# Patient Record
Sex: Female | Born: 1955 | Race: White | Hispanic: No | Marital: Married | Smoking: Former smoker
Health system: Southern US, Community
[De-identification: ages and names within clinical notes are randomized; demographics above are authoritative.]

## PROBLEM LIST (undated history)

## (undated) DIAGNOSIS — C801 Malignant (primary) neoplasm, unspecified: Secondary | ICD-10-CM

## (undated) DIAGNOSIS — N6019 Diffuse cystic mastopathy of unspecified breast: Secondary | ICD-10-CM

## (undated) HISTORY — PX: LUNG BIOPSY: SHX232

## (undated) HISTORY — PX: PORTACATH PLACEMENT: SHX2246

## (undated) HISTORY — DX: Malignant (primary) neoplasm, unspecified: C80.1

## (undated) HISTORY — DX: Diffuse cystic mastopathy of unspecified breast: N60.19

---

## 1992-09-04 HISTORY — PX: TUBAL LIGATION: SHX77

## 2001-12-06 ENCOUNTER — Other Ambulatory Visit: Admission: RE | Admit: 2001-12-06 | Discharge: 2001-12-06 | Payer: Self-pay | Admitting: Family Medicine

## 2005-02-16 ENCOUNTER — Ambulatory Visit: Payer: Self-pay | Admitting: General Surgery

## 2006-03-06 ENCOUNTER — Ambulatory Visit: Payer: Self-pay | Admitting: General Surgery

## 2006-09-04 HISTORY — PX: BREAST BIOPSY: SHX20

## 2007-03-13 ENCOUNTER — Ambulatory Visit: Payer: Self-pay | Admitting: General Surgery

## 2007-03-18 ENCOUNTER — Ambulatory Visit: Payer: Self-pay | Admitting: General Surgery

## 2007-09-13 ENCOUNTER — Ambulatory Visit: Payer: Self-pay | Admitting: General Surgery

## 2008-04-07 ENCOUNTER — Ambulatory Visit: Payer: Self-pay | Admitting: General Surgery

## 2009-04-09 ENCOUNTER — Ambulatory Visit: Payer: Self-pay | Admitting: General Surgery

## 2009-04-16 ENCOUNTER — Ambulatory Visit: Payer: Self-pay | Admitting: General Surgery

## 2010-04-12 ENCOUNTER — Ambulatory Visit: Payer: Self-pay

## 2010-04-15 ENCOUNTER — Ambulatory Visit: Payer: Self-pay

## 2011-04-18 ENCOUNTER — Ambulatory Visit: Payer: Self-pay

## 2012-05-02 ENCOUNTER — Ambulatory Visit: Payer: Self-pay

## 2013-01-24 ENCOUNTER — Encounter: Payer: Self-pay | Admitting: *Deleted

## 2013-03-04 DIAGNOSIS — C801 Malignant (primary) neoplasm, unspecified: Secondary | ICD-10-CM

## 2013-03-04 HISTORY — DX: Malignant (primary) neoplasm, unspecified: C80.1

## 2013-03-28 ENCOUNTER — Ambulatory Visit: Payer: Self-pay | Admitting: Family Medicine

## 2013-04-01 ENCOUNTER — Ambulatory Visit: Payer: Self-pay | Admitting: Family Medicine

## 2013-04-03 ENCOUNTER — Ambulatory Visit: Payer: Self-pay | Admitting: Oncology

## 2013-04-03 LAB — COMPREHENSIVE METABOLIC PANEL
Alkaline Phosphatase: 114 U/L (ref 50–136)
Bilirubin,Total: 0.2 mg/dL (ref 0.2–1.0)
Calcium, Total: 9.3 mg/dL (ref 8.5–10.1)
Chloride: 102 mmol/L (ref 98–107)
Co2: 29 mmol/L (ref 21–32)
Creatinine: 0.95 mg/dL (ref 0.60–1.30)
EGFR (Non-African Amer.): >60
Glucose: 102 mg/dL — ABNORMAL HIGH (ref 65–99)
Potassium: 3.8 mmol/L (ref 3.5–5.1)
SGPT (ALT): 23 U/L (ref 12–78)
Total Protein: 7.8 g/dL (ref 6.4–8.2)

## 2013-04-03 LAB — CBC CANCER CENTER
Basophil #: 0.1 x10 3/mm (ref 0.0–0.1)
Basophil %: 0.9 %
Eosinophil #: 0.1 x10 3/mm (ref 0.0–0.7)
Eosinophil %: 0.6 %
HCT: 41.7 % (ref 35.0–47.0)
Lymphocyte %: 17.5 %
MCH: 33 pg (ref 26.0–34.0)
MCHC: 34.4 g/dL (ref 32.0–36.0)
MCV: 96 fL (ref 80–100)
Monocyte %: 9 %
Neutrophil #: 5.8 x10 3/mm (ref 1.4–6.5)
Neutrophil %: 72 %
Platelet: 299 x10 3/mm (ref 150–440)
RBC: 4.35 10*6/uL (ref 3.80–5.20)
RDW: 12.8 % (ref 11.5–14.5)
WBC: 8.1 x10 3/mm (ref 3.6–11.0)

## 2013-04-04 ENCOUNTER — Ambulatory Visit: Payer: Self-pay | Admitting: Oncology

## 2013-04-04 ENCOUNTER — Ambulatory Visit: Payer: Self-pay | Admitting: Internal Medicine

## 2013-04-09 ENCOUNTER — Ambulatory Visit: Payer: Self-pay | Admitting: Oncology

## 2013-04-15 ENCOUNTER — Institutional Professional Consult (permissible substitution): Payer: Self-pay | Admitting: Pulmonary Disease

## 2013-04-16 ENCOUNTER — Ambulatory Visit: Payer: Self-pay | Admitting: Vascular Surgery

## 2013-04-22 LAB — COMPREHENSIVE METABOLIC PANEL
Alkaline Phosphatase: 111 U/L (ref 50–136)
Bilirubin,Total: 0.2 mg/dL (ref 0.2–1.0)
Chloride: 104 mmol/L (ref 98–107)
EGFR (African American): >60
Potassium: 3.7 mmol/L (ref 3.5–5.1)
SGOT(AST): 23 U/L (ref 15–37)

## 2013-04-22 LAB — CBC CANCER CENTER
Basophil %: 1.5 %
Eosinophil %: 2.5 %
HCT: 38 % (ref 35.0–47.0)
HGB: 13.3 g/dL (ref 12.0–16.0)
Lymphocyte #: 1 x10 3/mm (ref 1.0–3.6)
MCHC: 34.9 g/dL (ref 32.0–36.0)
Neutrophil #: 4.4 x10 3/mm (ref 1.4–6.5)
Neutrophil %: 73.5 %
Platelet: 324 x10 3/mm (ref 150–440)
RBC: 3.98 10*6/uL (ref 3.80–5.20)
RDW: 12.4 % (ref 11.5–14.5)

## 2013-04-29 LAB — CBC CANCER CENTER
Basophil #: 0.1 x10 3/mm (ref 0.0–0.1)
Basophil %: 2.4 %
HGB: 15 g/dL (ref 12.0–16.0)
Lymphocyte #: 1.1 x10 3/mm (ref 1.0–3.6)
MCV: 95 fL (ref 80–100)
Neutrophil #: 3.2 x10 3/mm (ref 1.4–6.5)
Neutrophil %: 70.5 %
Platelet: 226 x10 3/mm (ref 150–440)
RBC: 4.52 10*6/uL (ref 3.80–5.20)
RDW: 12.2 % (ref 11.5–14.5)

## 2013-04-29 LAB — BASIC METABOLIC PANEL
Anion Gap: 7 (ref 7–16)
Chloride: 99 mmol/L (ref 98–107)
Creatinine: 0.95 mg/dL (ref 0.60–1.30)
EGFR (African American): >60
Glucose: 119 mg/dL — ABNORMAL HIGH (ref 65–99)
Osmolality: 276 (ref 275–301)
Potassium: 4.7 mmol/L (ref 3.5–5.1)

## 2013-05-05 ENCOUNTER — Ambulatory Visit: Payer: Self-pay | Admitting: Oncology

## 2013-05-13 LAB — COMPREHENSIVE METABOLIC PANEL
Alkaline Phosphatase: 135 U/L (ref 50–136)
Anion Gap: 7 (ref 7–16)
BUN: 10 mg/dL (ref 7–18)
Bilirubin,Total: 0.2 mg/dL (ref 0.2–1.0)
Chloride: 105 mmol/L (ref 98–107)
Co2: 29 mmol/L (ref 21–32)
Creatinine: 1.1 mg/dL (ref 0.60–1.30)
EGFR (African American): >60
EGFR (Non-African Amer.): 56 — ABNORMAL LOW
Glucose: 102 mg/dL — ABNORMAL HIGH (ref 65–99)
Osmolality: 280 (ref 275–301)
Potassium: 3.7 mmol/L (ref 3.5–5.1)
SGOT(AST): 26 U/L (ref 15–37)
SGPT (ALT): 46 U/L (ref 12–78)
Sodium: 141 mmol/L (ref 136–145)
Total Protein: 7.2 g/dL (ref 6.4–8.2)

## 2013-05-13 LAB — CBC CANCER CENTER
Basophil #: 0.1 x10 3/mm (ref 0.0–0.1)
Basophil %: 1.8 %
Eosinophil %: 1.8 %
HCT: 38.1 % (ref 35.0–47.0)
Lymphocyte #: 1.4 x10 3/mm (ref 1.0–3.6)
Lymphocyte %: 39 %
MCH: 33 pg (ref 26.0–34.0)
MCV: 96 fL (ref 80–100)
Monocyte %: 15.5 %
Neutrophil #: 1.5 x10 3/mm (ref 1.4–6.5)
Platelet: 307 x10 3/mm (ref 150–440)
RBC: 3.97 10*6/uL (ref 3.80–5.20)
RDW: 12.5 % (ref 11.5–14.5)
WBC: 3.6 x10 3/mm (ref 3.6–11.0)

## 2013-05-14 ENCOUNTER — Ambulatory Visit: Payer: Self-pay

## 2013-05-20 LAB — CBC CANCER CENTER
Basophil #: 0 x10 3/mm (ref 0.0–0.1)
Eosinophil #: 0 x10 3/mm (ref 0.0–0.7)
Eosinophil %: 0.1 %
HGB: 13.4 g/dL (ref 12.0–16.0)
Lymphocyte %: 5 %
MCH: 33 pg (ref 26.0–34.0)
Monocyte %: 0.8 %
Neutrophil #: 8.3 x10 3/mm — ABNORMAL HIGH (ref 1.4–6.5)
Platelet: 258 x10 3/mm (ref 150–440)
WBC: 8.9 x10 3/mm (ref 3.6–11.0)

## 2013-05-30 LAB — CBC CANCER CENTER
Basophil #: 0 x10 3/mm (ref 0.0–0.1)
Basophil %: 1 %
HGB: 12.1 g/dL (ref 12.0–16.0)
MCHC: 34.3 g/dL (ref 32.0–36.0)
MCV: 97 fL (ref 80–100)
Monocyte #: 0.5 x10 3/mm (ref 0.2–0.9)
Monocyte %: 28.3 %
Neutrophil #: 0.4 x10 3/mm — ABNORMAL LOW (ref 1.4–6.5)
Neutrophil %: 23.5 %
RBC: 3.63 10*6/uL — ABNORMAL LOW (ref 3.80–5.20)
RDW: 13.4 % (ref 11.5–14.5)
WBC: 1.6 x10 3/mm — CL (ref 3.6–11.0)

## 2013-06-03 LAB — CBC CANCER CENTER
Basophil #: 0 x10 3/mm (ref 0.0–0.1)
Basophil %: 0.9 %
Lymphocyte #: 0.8 x10 3/mm — ABNORMAL LOW (ref 1.0–3.6)
Lymphocyte %: 21.6 %
MCH: 33.1 pg (ref 26.0–34.0)
Monocyte %: 17.9 %
Neutrophil %: 57.5 %
Platelet: 414 x10 3/mm (ref 150–440)
RBC: 3.66 10*6/uL — ABNORMAL LOW (ref 3.80–5.20)
RDW: 15.5 % — ABNORMAL HIGH (ref 11.5–14.5)

## 2013-06-03 LAB — COMPREHENSIVE METABOLIC PANEL
Albumin: 3.4 g/dL (ref 3.4–5.0)
Alkaline Phosphatase: 140 U/L — ABNORMAL HIGH (ref 50–136)
Anion Gap: 13 (ref 7–16)
BUN: 10 mg/dL (ref 7–18)
Bilirubin,Total: 0.1 mg/dL — ABNORMAL LOW (ref 0.2–1.0)
Chloride: 104 mmol/L (ref 98–107)
EGFR (African American): >60
Glucose: 111 mg/dL — ABNORMAL HIGH (ref 65–99)
Osmolality: 283 (ref 275–301)
Potassium: 3.7 mmol/L (ref 3.5–5.1)
SGOT(AST): 21 U/L (ref 15–37)
SGPT (ALT): 34 U/L (ref 12–78)
Total Protein: 7.2 g/dL (ref 6.4–8.2)

## 2013-06-04 ENCOUNTER — Ambulatory Visit: Payer: Self-pay | Admitting: Oncology

## 2013-06-10 LAB — BASIC METABOLIC PANEL
Anion Gap: 8 (ref 7–16)
BUN: 10 mg/dL (ref 7–18)
Calcium, Total: 8.9 mg/dL (ref 8.5–10.1)
Chloride: 97 mmol/L — ABNORMAL LOW (ref 98–107)
Co2: 28 mmol/L (ref 21–32)
Creatinine: 0.88 mg/dL (ref 0.60–1.30)
EGFR (Non-African Amer.): >60
Glucose: 145 mg/dL — ABNORMAL HIGH (ref 65–99)
Potassium: 4.1 mmol/L (ref 3.5–5.1)
Sodium: 133 mmol/L — ABNORMAL LOW (ref 136–145)

## 2013-06-10 LAB — CBC CANCER CENTER
Basophil #: 0.1 x10 3/mm (ref 0.0–0.1)
Basophil %: 0.6 %
Eosinophil #: 0 x10 3/mm (ref 0.0–0.7)
Eosinophil %: 0.1 %
HCT: 33.3 % — ABNORMAL LOW (ref 35.0–47.0)
HGB: 11.6 g/dL — ABNORMAL LOW (ref 12.0–16.0)
Lymphocyte #: 0.4 x10 3/mm — ABNORMAL LOW (ref 1.0–3.6)
MCH: 34.2 pg — ABNORMAL HIGH (ref 26.0–34.0)
MCHC: 34.8 g/dL (ref 32.0–36.0)
MCV: 98 fL (ref 80–100)
Monocyte %: 1 %
Neutrophil %: 93.8 %
Platelet: 252 x10 3/mm (ref 150–440)
RDW: 15.4 % — ABNORMAL HIGH (ref 11.5–14.5)
WBC: 8.4 x10 3/mm (ref 3.6–11.0)

## 2013-06-12 ENCOUNTER — Ambulatory Visit (INDEPENDENT_AMBULATORY_CARE_PROVIDER_SITE_OTHER): Payer: PRIVATE HEALTH INSURANCE | Admitting: General Surgery

## 2013-06-12 ENCOUNTER — Encounter: Payer: Self-pay | Admitting: General Surgery

## 2013-06-12 VITALS — BP 100/70 | HR 92 | Resp 14 | Ht 64.0 in | Wt 127.0 lb

## 2013-06-12 DIAGNOSIS — Z803 Family history of malignant neoplasm of breast: Secondary | ICD-10-CM

## 2013-06-12 DIAGNOSIS — N6019 Diffuse cystic mastopathy of unspecified breast: Secondary | ICD-10-CM

## 2013-06-12 DIAGNOSIS — Z85118 Personal history of other malignant neoplasm of bronchus and lung: Secondary | ICD-10-CM

## 2013-06-12 NOTE — Patient Instructions (Addendum)
Patient to return in 1 year for follow up. Continue self breast exam. Call for any problems.

## 2013-06-12 NOTE — Progress Notes (Signed)
Patient ID: Marcia Fuentes, female   DOB: Jan 08, 1956, 57 y.o.   MRN: [id number]  Chief Complaint  Patient presents with  . Follow-up    8 month follow up screening mammogram     HPI Marcia Fuentes is a 57 y.o. female who presents for a breast evaluation. The most recent mammogram was done on 05/14/13 with a birad category 1. Patient does perform regular self breast checks and gets regular mammograms done.  No new breast problems at this time. 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 The patient is undergoing radiation and chemotherapy at this time. She is due to complete treatment soon.   HPI  Past Medical History  Diagnosis Date  . Diffuse cystic mastopathy   . Cancer     lung    Past Surgical History  Procedure Laterality Date  . Tubal ligation  1994  . Breast biopsy Left 2008  . Portacath placement    . Lung biopsy      Family History  Problem Relation Age of Onset  . Cancer Mother     breast  . Cancer Maternal Aunt     breast  . Cancer Maternal Grandmother     breast  . Cancer Maternal Aunt     breast    Social History History  Substance Use Topics  . Smoking status: Former Smoker -- 1.00 packs/day for 30 years  . Smokeless tobacco: Never Used  . Alcohol Use: No    Allergies  Allergen Reactions  . Sulfa Antibiotics     Cramps     Current Outpatient Prescriptions  Medication Sig Dispense Refill  . benzonatate (TESSALON) 100 MG capsule Take 100 mg by mouth 3 (three) times daily as needed for cough.      . dexamethasone (DECADRON) 4 MG tablet Take 4 mg by mouth 2 (two) times daily with a meal.      . oxyCODONE-acetaminophen (PERCOCET) 7.5-325 MG per tablet Take 1 tablet by mouth every 6 (six) hours as needed for pain.      Marland Kitchen senna-docusate (SENOKOT-S) 8.6-50 MG per tablet Take 2 tablets by mouth at bedtime.      . sucralfate (CARAFATE) 1 G tablet Take 1 g by mouth 3 (three) times daily.       No current facility-administered  medications for this visit.    Review of Systems Review of Systems  Constitutional: Negative.   Respiratory: Negative.   Cardiovascular: Negative.     Blood pressure 100/70, pulse 92, resp. rate 14, height 5\' 4"  (1.626 m), weight 127 lb (57.607 kg).  Physical Exam Physical Exam  Constitutional: She is oriented to person, place, and time. She appears well-developed and well-nourished.  Eyes: Conjunctivae are normal. No scleral icterus.  Neck: No mass and no thyromegaly present.  Cardiovascular: Normal rate, regular rhythm and normal heart sounds.   No murmur heard. Pulses:      Dorsalis pedis pulses are 2+ on the right side, and 2+ on the left side.       Posterior tibial pulses are 2+ on the right side, and 2+ on the left side.  No edema present.  Pulmonary/Chest: Effort normal and breath sounds normal. Right breast exhibits no inverted nipple, no mass, no nipple discharge, no skin change and no tenderness. Left breast exhibits no inverted nipple, no mass, no nipple discharge, no skin change and no tenderness.  Abdominal: Soft. Bowel sounds are normal. There is no hepatosplenomegaly. There is no tenderness.  No hernia.  Lymphadenopathy:    She has no cervical adenopathy.    She has no axillary adenopathy.  Neurological: She is alert and oriented to person, place, and time.  Skin: Skin is warm and dry.    Data Reviewed Mammogram reviewed and stable.    Assessment    Patient's breast exam is stable. She is currently completing radiation and chemotherapy for lung cancer. Assuming she has had a good response we will plan for continued breast follow up due to family history of breast cancer.      Plan    Patient to return in 1 year with bilateral screening mammogram.        Gerlene Burdock G 06/12/2013, 7:12 PM

## 2013-06-13 LAB — CBC CANCER CENTER
Basophil #: 0 x10 3/mm (ref 0.0–0.1)
Basophil %: 0.8 %
HCT: 29.7 % — ABNORMAL LOW (ref 35.0–47.0)
Lymphocyte %: 28.8 %
MCH: 34.2 pg — ABNORMAL HIGH (ref 26.0–34.0)
MCV: 99 fL (ref 80–100)
Monocyte #: 0.1 x10 3/mm — ABNORMAL LOW (ref 0.2–0.9)
Monocyte %: 8.9 %
Neutrophil #: 0.8 x10 3/mm — ABNORMAL LOW (ref 1.4–6.5)
WBC: 1.3 x10 3/mm — CL (ref 3.6–11.0)

## 2013-06-24 LAB — COMPREHENSIVE METABOLIC PANEL WITH GFR
Albumin: 2.9 g/dL — ABNORMAL LOW (ref 3.4–5.0)
Alkaline Phosphatase: 102 U/L (ref 50–136)
Anion Gap: 10 (ref 7–16)
BUN: 14 mg/dL (ref 7–18)
Bilirubin,Total: 0.2 mg/dL (ref 0.2–1.0)
Calcium, Total: 8 mg/dL — ABNORMAL LOW (ref 8.5–10.1)
Chloride: 101 mmol/L (ref 98–107)
Co2: 25 mmol/L (ref 21–32)
Creatinine: 0.94 mg/dL (ref 0.60–1.30)
EGFR (African American): >60
EGFR (Non-African Amer.): >60
Glucose: 154 mg/dL — ABNORMAL HIGH (ref 65–99)
Osmolality: 276 (ref 275–301)
Potassium: 3.7 mmol/L (ref 3.5–5.1)
SGOT(AST): 107 U/L — ABNORMAL HIGH (ref 15–37)
SGPT (ALT): 189 U/L — ABNORMAL HIGH (ref 12–78)
Sodium: 136 mmol/L (ref 136–145)
Total Protein: 6.2 g/dL — ABNORMAL LOW (ref 6.4–8.2)

## 2013-06-24 LAB — CBC CANCER CENTER
Basophil #: 0.1 x10 3/mm (ref 0.0–0.1)
Basophil %: 0.8 %
Eosinophil #: 0.1 x10 3/mm (ref 0.0–0.7)
Eosinophil %: 1.1 %
HCT: 35.4 % (ref 35.0–47.0)
HGB: 12.1 g/dL (ref 12.0–16.0)
Lymphocyte %: 10.3 %
Lymphs Abs: 0.8 x10 3/mm — ABNORMAL LOW (ref 1.0–3.6)
MCH: 35.4 pg — ABNORMAL HIGH (ref 26.0–34.0)
MCHC: 34.1 g/dL (ref 32.0–36.0)
MCV: 104 fL — ABNORMAL HIGH (ref 80–100)
Monocyte #: 0.8 x10 3/mm (ref 0.2–0.9)
Monocyte %: 11.4 %
Neutrophil #: 5.7 x10 3/mm (ref 1.4–6.5)
Neutrophil %: 76.4 %
Platelet: 274 x10 3/mm (ref 150–440)
RBC: 3.41 x10 6/mm — ABNORMAL LOW (ref 3.80–5.20)
RDW: 20.7 % — ABNORMAL HIGH (ref 11.5–14.5)
WBC: 7.5 x10 3/mm (ref 3.6–11.0)

## 2013-07-01 LAB — CBC CANCER CENTER
Basophil %: 1.4 %
Eosinophil %: 0.1 %
HCT: 33.8 % — ABNORMAL LOW (ref 35.0–47.0)
HGB: 11.8 g/dL — ABNORMAL LOW (ref 12.0–16.0)
Lymphocyte #: 0.4 x10 3/mm — ABNORMAL LOW (ref 1.0–3.6)
Lymphocyte %: 10.3 %
MCH: 35.3 pg — ABNORMAL HIGH (ref 26.0–34.0)
MCHC: 35 g/dL (ref 32.0–36.0)
MCV: 101 fL — ABNORMAL HIGH (ref 80–100)
Neutrophil %: 87.3 %
Platelet: 155 x10 3/mm (ref 150–440)
RBC: 3.34 10*6/uL — ABNORMAL LOW (ref 3.80–5.20)
RDW: 19.2 % — ABNORMAL HIGH (ref 11.5–14.5)
WBC: 4.1 x10 3/mm (ref 3.6–11.0)

## 2013-07-01 LAB — BASIC METABOLIC PANEL
Anion Gap: 7 (ref 7–16)
BUN: 12 mg/dL (ref 7–18)
Chloride: 100 mmol/L (ref 98–107)
Co2: 29 mmol/L (ref 21–32)
Creatinine: 0.78 mg/dL (ref 0.60–1.30)
EGFR (Non-African Amer.): >60
Osmolality: 271 (ref 275–301)
Potassium: 3.9 mmol/L (ref 3.5–5.1)
Sodium: 136 mmol/L (ref 136–145)

## 2013-07-05 ENCOUNTER — Ambulatory Visit: Payer: Self-pay | Admitting: Oncology

## 2013-08-05 ENCOUNTER — Ambulatory Visit: Payer: Self-pay | Admitting: Oncology

## 2013-08-05 LAB — CBC CANCER CENTER
Basophil %: 0.6 %
Eosinophil %: 0.5 %
HCT: 36.8 % (ref 35.0–47.0)
HGB: 12.5 g/dL (ref 12.0–16.0)
Lymphocyte #: 1 x10 3/mm (ref 1.0–3.6)
MCH: 36.6 pg — ABNORMAL HIGH (ref 26.0–34.0)
MCHC: 33.9 g/dL (ref 32.0–36.0)
Monocyte %: 12.8 %
Neutrophil #: 8.8 x10 3/mm — ABNORMAL HIGH (ref 1.4–6.5)
RDW: 15.1 % — ABNORMAL HIGH (ref 11.5–14.5)

## 2013-08-05 LAB — COMPREHENSIVE METABOLIC PANEL
Alkaline Phosphatase: 99 U/L
Anion Gap: 9 (ref 7–16)
Chloride: 101 mmol/L (ref 98–107)
Co2: 26 mmol/L (ref 21–32)
Creatinine: 0.87 mg/dL (ref 0.60–1.30)
EGFR (African American): >60
Glucose: 106 mg/dL — ABNORMAL HIGH (ref 65–99)
Osmolality: 271 (ref 275–301)
Total Protein: 7.2 g/dL (ref 6.4–8.2)

## 2013-09-04 ENCOUNTER — Ambulatory Visit: Payer: Self-pay | Admitting: Oncology

## 2013-10-05 ENCOUNTER — Ambulatory Visit: Payer: Self-pay | Admitting: Oncology

## 2013-11-05 ENCOUNTER — Ambulatory Visit: Payer: Self-pay | Admitting: Oncology

## 2013-11-06 ENCOUNTER — Ambulatory Visit: Payer: Self-pay | Admitting: Oncology

## 2013-11-07 LAB — CBC CANCER CENTER
BASOS PCT: 1.1 %
Basophil #: 0.1 x10 3/mm (ref 0.0–0.1)
Eosinophil #: 0.1 x10 3/mm (ref 0.0–0.7)
Eosinophil %: 2.1 %
HCT: 40.7 % (ref 35.0–47.0)
HGB: 13.4 g/dL (ref 12.0–16.0)
Lymphocyte #: 1.3 x10 3/mm (ref 1.0–3.6)
Lymphocyte %: 20.2 %
MCH: 32.2 pg (ref 26.0–34.0)
MCHC: 33 g/dL (ref 32.0–36.0)
MCV: 97 fL (ref 80–100)
MONOS PCT: 10.9 %
Monocyte #: 0.7 x10 3/mm (ref 0.2–0.9)
Neutrophil #: 4.3 x10 3/mm (ref 1.4–6.5)
Neutrophil %: 65.7 %
Platelet: 299 x10 3/mm (ref 150–440)
RBC: 4.18 10*6/uL (ref 3.80–5.20)
RDW: 13.1 % (ref 11.5–14.5)
WBC: 6.5 x10 3/mm (ref 3.6–11.0)

## 2013-11-07 LAB — COMPREHENSIVE METABOLIC PANEL
ANION GAP: 8 (ref 7–16)
AST: 20 U/L (ref 15–37)
Albumin: 3.5 g/dL (ref 3.4–5.0)
Alkaline Phosphatase: 94 U/L
BUN: 11 mg/dL (ref 7–18)
Bilirubin,Total: 0.3 mg/dL (ref 0.2–1.0)
CO2: 29 mmol/L (ref 21–32)
Calcium, Total: 9.5 mg/dL (ref 8.5–10.1)
Chloride: 102 mmol/L (ref 98–107)
Creatinine: 0.97 mg/dL (ref 0.60–1.30)
Glucose: 126 mg/dL — ABNORMAL HIGH (ref 65–99)
Osmolality: 278 (ref 275–301)
Potassium: 3.7 mmol/L (ref 3.5–5.1)
SGPT (ALT): 25 U/L (ref 12–78)
SODIUM: 139 mmol/L (ref 136–145)
Total Protein: 7.5 g/dL (ref 6.4–8.2)

## 2013-11-11 ENCOUNTER — Ambulatory Visit: Payer: Self-pay | Admitting: Vascular Surgery

## 2013-12-03 ENCOUNTER — Ambulatory Visit: Payer: Self-pay | Admitting: Oncology

## 2014-02-09 ENCOUNTER — Ambulatory Visit: Payer: Self-pay | Admitting: Oncology

## 2014-02-12 ENCOUNTER — Ambulatory Visit: Payer: Self-pay | Admitting: Oncology

## 2014-02-12 LAB — COMPREHENSIVE METABOLIC PANEL
ALBUMIN: 3.7 g/dL (ref 3.4–5.0)
Alkaline Phosphatase: 86 U/L
Anion Gap: 7 (ref 7–16)
BUN: 11 mg/dL (ref 7–18)
Bilirubin,Total: 0.3 mg/dL (ref 0.2–1.0)
CO2: 30 mmol/L (ref 21–32)
CREATININE: 0.88 mg/dL (ref 0.60–1.30)
Calcium, Total: 9.3 mg/dL (ref 8.5–10.1)
Chloride: 105 mmol/L (ref 98–107)
EGFR (African American): >60
Glucose: 125 mg/dL — ABNORMAL HIGH (ref 65–99)
Osmolality: 284 (ref 275–301)
Potassium: 4.1 mmol/L (ref 3.5–5.1)
SGOT(AST): 22 U/L (ref 15–37)
SGPT (ALT): 32 U/L (ref 12–78)
Sodium: 142 mmol/L (ref 136–145)
Total Protein: 7.3 g/dL (ref 6.4–8.2)

## 2014-02-12 LAB — CBC CANCER CENTER
Basophil #: 0 x10 3/mm (ref 0.0–0.1)
Basophil %: 0.9 %
Eosinophil #: 0.1 x10 3/mm (ref 0.0–0.7)
Eosinophil %: 2.1 %
HCT: 40.2 % (ref 35.0–47.0)
HGB: 13.7 g/dL (ref 12.0–16.0)
LYMPHS PCT: 19.1 %
Lymphocyte #: 0.9 x10 3/mm — ABNORMAL LOW (ref 1.0–3.6)
MCH: 33.4 pg (ref 26.0–34.0)
MCHC: 34 g/dL (ref 32.0–36.0)
MCV: 98 fL (ref 80–100)
MONOS PCT: 9.5 %
Monocyte #: 0.5 x10 3/mm (ref 0.2–0.9)
Neutrophil #: 3.4 x10 3/mm (ref 1.4–6.5)
Neutrophil %: 68.4 %
Platelet: 231 x10 3/mm (ref 150–440)
RBC: 4.09 10*6/uL (ref 3.80–5.20)
RDW: 13 % (ref 11.5–14.5)
WBC: 4.9 x10 3/mm (ref 3.6–11.0)

## 2014-02-18 ENCOUNTER — Ambulatory Visit: Payer: Self-pay | Admitting: Oncology

## 2014-02-23 LAB — CBC CANCER CENTER
BASOS ABS: 0.1 x10 3/mm (ref 0.0–0.1)
Basophil %: 1 %
Eosinophil #: 0.1 x10 3/mm (ref 0.0–0.7)
Eosinophil %: 1.6 %
HCT: 41.7 % (ref 35.0–47.0)
HGB: 13.6 g/dL (ref 12.0–16.0)
LYMPHS ABS: 1 x10 3/mm (ref 1.0–3.6)
LYMPHS PCT: 19.9 %
MCH: 32.3 pg (ref 26.0–34.0)
MCHC: 32.6 g/dL (ref 32.0–36.0)
MCV: 99 fL (ref 80–100)
Monocyte #: 0.4 x10 3/mm (ref 0.2–0.9)
Monocyte %: 7.8 %
NEUTROS PCT: 69.7 %
Neutrophil #: 3.4 x10 3/mm (ref 1.4–6.5)
Platelet: 223 x10 3/mm (ref 150–440)
RBC: 4.21 10*6/uL (ref 3.80–5.20)
RDW: 12.6 % (ref 11.5–14.5)
WBC: 4.8 x10 3/mm (ref 3.6–11.0)

## 2014-02-23 LAB — COMPREHENSIVE METABOLIC PANEL
ALT: 31 U/L (ref 12–78)
AST: 24 U/L (ref 15–37)
Albumin: 3.8 g/dL (ref 3.4–5.0)
Alkaline Phosphatase: 86 U/L
Anion Gap: 7 (ref 7–16)
BUN: 9 mg/dL (ref 7–18)
Bilirubin,Total: 0.3 mg/dL (ref 0.2–1.0)
CO2: 29 mmol/L (ref 21–32)
Calcium, Total: 9.5 mg/dL (ref 8.5–10.1)
Chloride: 104 mmol/L (ref 98–107)
Creatinine: 1.01 mg/dL (ref 0.60–1.30)
EGFR (Non-African Amer.): >60
Glucose: 132 mg/dL — ABNORMAL HIGH (ref 65–99)
Osmolality: 280 (ref 275–301)
Potassium: 3.7 mmol/L (ref 3.5–5.1)
SODIUM: 140 mmol/L (ref 136–145)
TOTAL PROTEIN: 7.5 g/dL (ref 6.4–8.2)

## 2014-03-03 LAB — COMPREHENSIVE METABOLIC PANEL
ALBUMIN: 3.6 g/dL (ref 3.4–5.0)
Alkaline Phosphatase: 89 U/L
Anion Gap: 7 (ref 7–16)
BILIRUBIN TOTAL: 0.1 mg/dL — AB (ref 0.2–1.0)
BUN: 14 mg/dL (ref 7–18)
CALCIUM: 9.3 mg/dL (ref 8.5–10.1)
CREATININE: 1.06 mg/dL (ref 0.60–1.30)
Chloride: 105 mmol/L (ref 98–107)
Co2: 31 mmol/L (ref 21–32)
EGFR (Non-African Amer.): 59 — ABNORMAL LOW
Glucose: 115 mg/dL — ABNORMAL HIGH (ref 65–99)
OSMOLALITY: 286 (ref 275–301)
Potassium: 3.9 mmol/L (ref 3.5–5.1)
SGOT(AST): 30 U/L (ref 15–37)
SGPT (ALT): 62 U/L (ref 12–78)
Sodium: 143 mmol/L (ref 136–145)
Total Protein: 7.2 g/dL (ref 6.4–8.2)

## 2014-03-03 LAB — CBC CANCER CENTER
Basophil #: 0 x10 3/mm (ref 0.0–0.1)
Basophil %: 0.9 %
Eosinophil #: 0.1 x10 3/mm (ref 0.0–0.7)
Eosinophil %: 3.9 %
HCT: 36.9 % (ref 35.0–47.0)
HGB: 12.4 g/dL (ref 12.0–16.0)
Lymphocyte #: 1 x10 3/mm (ref 1.0–3.6)
Lymphocyte %: 27.9 %
MCH: 33.2 pg (ref 26.0–34.0)
MCHC: 33.7 g/dL (ref 32.0–36.0)
MCV: 99 fL (ref 80–100)
MONO ABS: 0.2 x10 3/mm (ref 0.2–0.9)
Monocyte %: 7 %
Neutrophil #: 2.2 x10 3/mm (ref 1.4–6.5)
Neutrophil %: 60.3 %
PLATELETS: 127 x10 3/mm — AB (ref 150–440)
RBC: 3.74 10*6/uL — AB (ref 3.80–5.20)
RDW: 12.4 % (ref 11.5–14.5)
WBC: 3.6 x10 3/mm (ref 3.6–11.0)

## 2014-03-04 ENCOUNTER — Ambulatory Visit: Payer: Self-pay | Admitting: Vascular Surgery

## 2014-03-04 ENCOUNTER — Ambulatory Visit: Payer: Self-pay | Admitting: Oncology

## 2014-03-10 ENCOUNTER — Ambulatory Visit: Payer: Self-pay | Admitting: Oncology

## 2014-03-10 LAB — CBC CANCER CENTER
BASOS ABS: 0 x10 3/mm (ref 0.0–0.1)
BASOS PCT: 1.1 %
EOS ABS: 0.1 x10 3/mm (ref 0.0–0.7)
Eosinophil %: 3.8 %
HCT: 34.6 % — ABNORMAL LOW (ref 35.0–47.0)
HGB: 11.7 g/dL — ABNORMAL LOW (ref 12.0–16.0)
Lymphocyte #: 0.9 x10 3/mm — ABNORMAL LOW (ref 1.0–3.6)
Lymphocyte %: 36.5 %
MCH: 33 pg (ref 26.0–34.0)
MCHC: 33.7 g/dL (ref 32.0–36.0)
MCV: 98 fL (ref 80–100)
Monocyte #: 0.1 x10 3/mm — ABNORMAL LOW (ref 0.2–0.9)
Monocyte %: 5.2 %
NEUTROS ABS: 1.3 x10 3/mm — AB (ref 1.4–6.5)
Neutrophil %: 53.4 %
Platelet: 96 x10 3/mm — ABNORMAL LOW (ref 150–440)
RBC: 3.53 10*6/uL — ABNORMAL LOW (ref 3.80–5.20)
RDW: 11.9 % (ref 11.5–14.5)
WBC: 2.5 x10 3/mm — AB (ref 3.6–11.0)

## 2014-03-10 LAB — COMPREHENSIVE METABOLIC PANEL
ALK PHOS: 92 U/L
AST: 33 U/L (ref 15–37)
Albumin: 3.4 g/dL (ref 3.4–5.0)
Anion Gap: 8 (ref 7–16)
BUN: 12 mg/dL (ref 7–18)
Bilirubin,Total: 0.2 mg/dL (ref 0.2–1.0)
CALCIUM: 9 mg/dL (ref 8.5–10.1)
Chloride: 105 mmol/L (ref 98–107)
Co2: 28 mmol/L (ref 21–32)
Creatinine: 0.9 mg/dL (ref 0.60–1.30)
EGFR (African American): >60
EGFR (Non-African Amer.): >60
GLUCOSE: 113 mg/dL — AB (ref 65–99)
Osmolality: 282 (ref 275–301)
Potassium: 3.9 mmol/L (ref 3.5–5.1)
SGPT (ALT): 60 U/L (ref 12–78)
Sodium: 141 mmol/L (ref 136–145)
Total Protein: 6.9 g/dL (ref 6.4–8.2)

## 2014-03-17 LAB — CBC CANCER CENTER
Basophil #: 0 x10 3/mm (ref 0.0–0.1)
Basophil %: 0.8 %
EOS PCT: 3.2 %
Eosinophil #: 0.1 x10 3/mm (ref 0.0–0.7)
HCT: 33.9 % — ABNORMAL LOW (ref 35.0–47.0)
HGB: 11.6 g/dL — ABNORMAL LOW (ref 12.0–16.0)
LYMPHS PCT: 43.4 %
Lymphocyte #: 0.8 x10 3/mm — ABNORMAL LOW (ref 1.0–3.6)
MCH: 33.3 pg (ref 26.0–34.0)
MCHC: 34.2 g/dL (ref 32.0–36.0)
MCV: 97 fL (ref 80–100)
MONOS PCT: 6.4 %
Monocyte #: 0.1 x10 3/mm — ABNORMAL LOW (ref 0.2–0.9)
Neutrophil #: 0.8 x10 3/mm — ABNORMAL LOW (ref 1.4–6.5)
Neutrophil %: 46.2 %
Platelet: 115 x10 3/mm — ABNORMAL LOW (ref 150–440)
RBC: 3.48 10*6/uL — ABNORMAL LOW (ref 3.80–5.20)
RDW: 11.8 % (ref 11.5–14.5)
WBC: 1.8 x10 3/mm — AB (ref 3.6–11.0)

## 2014-03-24 LAB — COMPREHENSIVE METABOLIC PANEL
ALK PHOS: 97 U/L
ALT: 64 U/L (ref 12–78)
ANION GAP: 7 (ref 7–16)
Albumin: 3.5 g/dL (ref 3.4–5.0)
BILIRUBIN TOTAL: 0.3 mg/dL (ref 0.2–1.0)
BUN: 9 mg/dL (ref 7–18)
CALCIUM: 9 mg/dL (ref 8.5–10.1)
CHLORIDE: 106 mmol/L (ref 98–107)
Co2: 29 mmol/L (ref 21–32)
Creatinine: 0.99 mg/dL (ref 0.60–1.30)
EGFR (Non-African Amer.): >60
Glucose: 106 mg/dL — ABNORMAL HIGH (ref 65–99)
Osmolality: 282 (ref 275–301)
POTASSIUM: 3.9 mmol/L (ref 3.5–5.1)
SGOT(AST): 34 U/L (ref 15–37)
Sodium: 142 mmol/L (ref 136–145)
Total Protein: 7.1 g/dL (ref 6.4–8.2)

## 2014-03-24 LAB — CBC CANCER CENTER
Basophil #: 0 x10 3/mm (ref 0.0–0.1)
Basophil %: 1.5 %
Eosinophil #: 0.1 x10 3/mm (ref 0.0–0.7)
Eosinophil %: 2.8 %
HCT: 35.7 % (ref 35.0–47.0)
HGB: 12 g/dL (ref 12.0–16.0)
Lymphocyte #: 0.7 x10 3/mm — ABNORMAL LOW (ref 1.0–3.6)
Lymphocyte %: 34.6 %
MCH: 33.7 pg (ref 26.0–34.0)
MCHC: 33.5 g/dL (ref 32.0–36.0)
MCV: 101 fL — AB (ref 80–100)
MONO ABS: 0.4 x10 3/mm (ref 0.2–0.9)
Monocyte %: 18.6 %
Neutrophil #: 0.9 x10 3/mm — ABNORMAL LOW (ref 1.4–6.5)
Neutrophil %: 42.5 %
PLATELETS: 299 x10 3/mm (ref 150–440)
RBC: 3.55 10*6/uL — AB (ref 3.80–5.20)
RDW: 13.4 % (ref 11.5–14.5)
WBC: 2.2 x10 3/mm — ABNORMAL LOW (ref 3.6–11.0)

## 2014-03-31 LAB — CBC CANCER CENTER
BASOS PCT: 2.1 %
Basophil #: 0.1 x10 3/mm (ref 0.0–0.1)
EOS ABS: 0.1 x10 3/mm (ref 0.0–0.7)
EOS PCT: 2.7 %
HCT: 37.7 % (ref 35.0–47.0)
HGB: 12.8 g/dL (ref 12.0–16.0)
LYMPHS ABS: 0.7 x10 3/mm — AB (ref 1.0–3.6)
Lymphocyte %: 25.2 %
MCH: 34.5 pg — AB (ref 26.0–34.0)
MCHC: 34 g/dL (ref 32.0–36.0)
MCV: 102 fL — AB (ref 80–100)
MONOS PCT: 21.6 %
Monocyte #: 0.6 x10 3/mm (ref 0.2–0.9)
Neutrophil #: 1.4 x10 3/mm (ref 1.4–6.5)
Neutrophil %: 48.4 %
PLATELETS: 329 x10 3/mm (ref 150–440)
RBC: 3.71 10*6/uL — ABNORMAL LOW (ref 3.80–5.20)
RDW: 15.7 % — ABNORMAL HIGH (ref 11.5–14.5)
WBC: 2.9 x10 3/mm — ABNORMAL LOW (ref 3.6–11.0)

## 2014-03-31 LAB — COMPREHENSIVE METABOLIC PANEL
ALBUMIN: 3.5 g/dL (ref 3.4–5.0)
AST: 25 U/L (ref 15–37)
Alkaline Phosphatase: 96 U/L
Anion Gap: 9 (ref 7–16)
BILIRUBIN TOTAL: 0.2 mg/dL (ref 0.2–1.0)
BUN: 9 mg/dL (ref 7–18)
CALCIUM: 9.1 mg/dL (ref 8.5–10.1)
Chloride: 105 mmol/L (ref 98–107)
Co2: 27 mmol/L (ref 21–32)
Creatinine: 0.93 mg/dL (ref 0.60–1.30)
EGFR (African American): >60
Glucose: 99 mg/dL (ref 65–99)
OSMOLALITY: 280 (ref 275–301)
Potassium: 3.7 mmol/L (ref 3.5–5.1)
SGPT (ALT): 44 U/L
Sodium: 141 mmol/L (ref 136–145)
Total Protein: 6.9 g/dL (ref 6.4–8.2)

## 2014-04-04 ENCOUNTER — Ambulatory Visit: Payer: Self-pay | Admitting: Oncology

## 2014-04-07 LAB — COMPREHENSIVE METABOLIC PANEL
ALBUMIN: 3.5 g/dL (ref 3.4–5.0)
ALK PHOS: 89 U/L
ALT: 88 U/L — AB
AST: 53 U/L — AB (ref 15–37)
Anion Gap: 8 (ref 7–16)
BILIRUBIN TOTAL: 0.2 mg/dL (ref 0.2–1.0)
BUN: 12 mg/dL (ref 7–18)
CALCIUM: 9.1 mg/dL (ref 8.5–10.1)
CO2: 27 mmol/L (ref 21–32)
CREATININE: 1.02 mg/dL (ref 0.60–1.30)
Chloride: 107 mmol/L (ref 98–107)
EGFR (Non-African Amer.): >60
GLUCOSE: 93 mg/dL (ref 65–99)
Osmolality: 283 (ref 275–301)
Potassium: 3.9 mmol/L (ref 3.5–5.1)
Sodium: 142 mmol/L (ref 136–145)
TOTAL PROTEIN: 7 g/dL (ref 6.4–8.2)

## 2014-04-07 LAB — CBC CANCER CENTER
Basophil #: 0.1 x10 3/mm (ref 0.0–0.1)
Basophil %: 1.2 %
Eosinophil #: 0.1 x10 3/mm (ref 0.0–0.7)
Eosinophil %: 1.7 %
HCT: 36 % (ref 35.0–47.0)
HGB: 12.2 g/dL (ref 12.0–16.0)
Lymphocyte #: 1.1 x10 3/mm (ref 1.0–3.6)
Lymphocyte %: 26.4 %
MCH: 34.3 pg — ABNORMAL HIGH (ref 26.0–34.0)
MCHC: 33.9 g/dL (ref 32.0–36.0)
MCV: 101 fL — ABNORMAL HIGH (ref 80–100)
MONOS PCT: 8.8 %
Monocyte #: 0.4 x10 3/mm (ref 0.2–0.9)
Neutrophil #: 2.6 x10 3/mm (ref 1.4–6.5)
Neutrophil %: 61.9 %
PLATELETS: 158 x10 3/mm (ref 150–440)
RBC: 3.56 10*6/uL — ABNORMAL LOW (ref 3.80–5.20)
RDW: 14.7 % — AB (ref 11.5–14.5)
WBC: 4.3 x10 3/mm (ref 3.6–11.0)

## 2014-04-14 LAB — CBC CANCER CENTER
BASOS ABS: 0 x10 3/mm (ref 0.0–0.1)
Basophil %: 0.8 %
EOS PCT: 2.6 %
Eosinophil #: 0.1 x10 3/mm (ref 0.0–0.7)
HCT: 32.6 % — AB (ref 35.0–47.0)
HGB: 11 g/dL — ABNORMAL LOW (ref 12.0–16.0)
LYMPHS ABS: 0.8 x10 3/mm — AB (ref 1.0–3.6)
Lymphocyte %: 21.9 %
MCH: 34.4 pg — AB (ref 26.0–34.0)
MCHC: 33.9 g/dL (ref 32.0–36.0)
MCV: 101 fL — ABNORMAL HIGH (ref 80–100)
MONOS PCT: 7.3 %
Monocyte #: 0.3 x10 3/mm (ref 0.2–0.9)
Neutrophil #: 2.5 x10 3/mm (ref 1.4–6.5)
Neutrophil %: 67.4 %
Platelet: 98 x10 3/mm — ABNORMAL LOW (ref 150–440)
RBC: 3.21 10*6/uL — ABNORMAL LOW (ref 3.80–5.20)
RDW: 14.8 % — ABNORMAL HIGH (ref 11.5–14.5)
WBC: 3.8 x10 3/mm (ref 3.6–11.0)

## 2014-04-14 LAB — BASIC METABOLIC PANEL
Anion Gap: 8 (ref 7–16)
BUN: 9 mg/dL (ref 7–18)
CALCIUM: 9.2 mg/dL (ref 8.5–10.1)
Chloride: 106 mmol/L (ref 98–107)
Co2: 28 mmol/L (ref 21–32)
Creatinine: 1.05 mg/dL (ref 0.60–1.30)
GFR CALC NON AF AMER: 59 — AB
Glucose: 105 mg/dL — ABNORMAL HIGH (ref 65–99)
OSMOLALITY: 282 (ref 275–301)
Potassium: 3.9 mmol/L (ref 3.5–5.1)
SODIUM: 142 mmol/L (ref 136–145)

## 2014-04-21 LAB — CBC CANCER CENTER
Basophil #: 0 x10 3/mm (ref 0.0–0.1)
Basophil %: 1 %
Eosinophil #: 0.1 x10 3/mm (ref 0.0–0.7)
Eosinophil %: 4 %
HCT: 32.6 % — ABNORMAL LOW (ref 35.0–47.0)
HGB: 10.9 g/dL — AB (ref 12.0–16.0)
Lymphocyte #: 1 x10 3/mm (ref 1.0–3.6)
Lymphocyte %: 32.7 %
MCH: 34.5 pg — AB (ref 26.0–34.0)
MCHC: 33.6 g/dL (ref 32.0–36.0)
MCV: 103 fL — ABNORMAL HIGH (ref 80–100)
MONOS PCT: 9.6 %
Monocyte #: 0.3 x10 3/mm (ref 0.2–0.9)
NEUTROS PCT: 52.7 %
Neutrophil #: 1.5 x10 3/mm (ref 1.4–6.5)
Platelet: 137 x10 3/mm — ABNORMAL LOW (ref 150–440)
RBC: 3.18 10*6/uL — AB (ref 3.80–5.20)
RDW: 15.1 % — ABNORMAL HIGH (ref 11.5–14.5)
WBC: 2.9 x10 3/mm — ABNORMAL LOW (ref 3.6–11.0)

## 2014-04-28 LAB — CBC CANCER CENTER
BASOS PCT: 1.3 %
Basophil #: 0 x10 3/mm (ref 0.0–0.1)
EOS PCT: 4.4 %
Eosinophil #: 0.2 x10 3/mm (ref 0.0–0.7)
HCT: 34.4 % — ABNORMAL LOW (ref 35.0–47.0)
HGB: 11.6 g/dL — AB (ref 12.0–16.0)
LYMPHS ABS: 0.8 x10 3/mm — AB (ref 1.0–3.6)
LYMPHS PCT: 23.9 %
MCH: 35.5 pg — AB (ref 26.0–34.0)
MCHC: 33.8 g/dL (ref 32.0–36.0)
MCV: 105 fL — ABNORMAL HIGH (ref 80–100)
MONO ABS: 0.5 x10 3/mm (ref 0.2–0.9)
Monocyte %: 14 %
Neutrophil #: 2 x10 3/mm (ref 1.4–6.5)
Neutrophil %: 56.4 %
PLATELETS: 284 x10 3/mm (ref 150–440)
RBC: 3.28 10*6/uL — ABNORMAL LOW (ref 3.80–5.20)
RDW: 17.7 % — ABNORMAL HIGH (ref 11.5–14.5)
WBC: 3.5 x10 3/mm — AB (ref 3.6–11.0)

## 2014-04-28 LAB — BASIC METABOLIC PANEL
Anion Gap: 10 (ref 7–16)
BUN: 9 mg/dL (ref 7–18)
CALCIUM: 9.3 mg/dL (ref 8.5–10.1)
CO2: 27 mmol/L (ref 21–32)
Chloride: 106 mmol/L (ref 98–107)
Creatinine: 0.89 mg/dL (ref 0.60–1.30)
EGFR (African American): >60
EGFR (Non-African Amer.): >60
GLUCOSE: 94 mg/dL (ref 65–99)
Osmolality: 283 (ref 275–301)
Potassium: 3.9 mmol/L (ref 3.5–5.1)
Sodium: 143 mmol/L (ref 136–145)

## 2014-05-05 ENCOUNTER — Ambulatory Visit: Payer: Self-pay | Admitting: Oncology

## 2014-05-05 LAB — CBC CANCER CENTER
Basophil #: 0.1 x10 3/mm (ref 0.0–0.1)
Basophil %: 1.8 %
Eosinophil #: 0.2 x10 3/mm (ref 0.0–0.7)
Eosinophil %: 3.8 %
HCT: 35 % (ref 35.0–47.0)
HGB: 11.8 g/dL — ABNORMAL LOW (ref 12.0–16.0)
LYMPHS ABS: 0.9 x10 3/mm — AB (ref 1.0–3.6)
LYMPHS PCT: 22.4 %
MCH: 35.4 pg — ABNORMAL HIGH (ref 26.0–34.0)
MCHC: 33.8 g/dL (ref 32.0–36.0)
MCV: 105 fL — ABNORMAL HIGH (ref 80–100)
Monocyte #: 0.5 x10 3/mm (ref 0.2–0.9)
Monocyte %: 11.5 %
Neutrophil #: 2.5 x10 3/mm (ref 1.4–6.5)
Neutrophil %: 60.5 %
Platelet: 241 x10 3/mm (ref 150–440)
RBC: 3.33 10*6/uL — AB (ref 3.80–5.20)
RDW: 16.6 % — ABNORMAL HIGH (ref 11.5–14.5)
WBC: 4.1 x10 3/mm (ref 3.6–11.0)

## 2014-05-05 LAB — BASIC METABOLIC PANEL
Anion Gap: 11 (ref 7–16)
BUN: 7 mg/dL (ref 7–18)
CHLORIDE: 105 mmol/L (ref 98–107)
Calcium, Total: 9 mg/dL (ref 8.5–10.1)
Co2: 26 mmol/L (ref 21–32)
Creatinine: 1.16 mg/dL (ref 0.60–1.30)
EGFR (African American): >60
EGFR (Non-African Amer.): 52 — ABNORMAL LOW
Glucose: 115 mg/dL — ABNORMAL HIGH (ref 65–99)
Osmolality: 282 (ref 275–301)
Potassium: 3.9 mmol/L (ref 3.5–5.1)
Sodium: 142 mmol/L (ref 136–145)

## 2014-05-12 LAB — CBC CANCER CENTER
BASOS PCT: 1.1 %
Basophil #: 0 x10 3/mm (ref 0.0–0.1)
EOS ABS: 0.1 x10 3/mm (ref 0.0–0.7)
EOS PCT: 2.8 %
HCT: 32.6 % — ABNORMAL LOW (ref 35.0–47.0)
HGB: 11 g/dL — ABNORMAL LOW (ref 12.0–16.0)
Lymphocyte #: 1 x10 3/mm (ref 1.0–3.6)
Lymphocyte %: 23.9 %
MCH: 35.4 pg — ABNORMAL HIGH (ref 26.0–34.0)
MCHC: 33.6 g/dL (ref 32.0–36.0)
MCV: 105 fL — ABNORMAL HIGH (ref 80–100)
Monocyte #: 0.3 x10 3/mm (ref 0.2–0.9)
Monocyte %: 6.4 %
NEUTROS ABS: 2.7 x10 3/mm (ref 1.4–6.5)
Neutrophil %: 65.8 %
PLATELETS: 116 x10 3/mm — AB (ref 150–440)
RBC: 3.1 10*6/uL — ABNORMAL LOW (ref 3.80–5.20)
RDW: 15.9 % — ABNORMAL HIGH (ref 11.5–14.5)
WBC: 4.1 x10 3/mm (ref 3.6–11.0)

## 2014-05-12 LAB — BASIC METABOLIC PANEL
ANION GAP: 7 (ref 7–16)
BUN: 10 mg/dL (ref 7–18)
CALCIUM: 9.1 mg/dL (ref 8.5–10.1)
CO2: 28 mmol/L (ref 21–32)
CREATININE: 1.15 mg/dL (ref 0.60–1.30)
Chloride: 105 mmol/L (ref 98–107)
EGFR (Non-African Amer.): 53 — ABNORMAL LOW
Glucose: 156 mg/dL — ABNORMAL HIGH (ref 65–99)
Osmolality: 282 (ref 275–301)
Potassium: 3.6 mmol/L (ref 3.5–5.1)
Sodium: 140 mmol/L (ref 136–145)

## 2014-05-28 ENCOUNTER — Ambulatory Visit: Payer: Self-pay | Admitting: Oncology

## 2014-06-02 LAB — CBC CANCER CENTER
BASOS ABS: 0.1 x10 3/mm (ref 0.0–0.1)
BASOS PCT: 1.1 %
EOS ABS: 0.2 x10 3/mm (ref 0.0–0.7)
Eosinophil %: 3.8 %
HCT: 38.9 % (ref 35.0–47.0)
HGB: 12.7 g/dL (ref 12.0–16.0)
LYMPHS ABS: 1.3 x10 3/mm (ref 1.0–3.6)
LYMPHS PCT: 25.3 %
MCH: 35.7 pg — ABNORMAL HIGH (ref 26.0–34.0)
MCHC: 32.8 g/dL (ref 32.0–36.0)
MCV: 109 fL — ABNORMAL HIGH (ref 80–100)
Monocyte #: 0.8 x10 3/mm (ref 0.2–0.9)
Monocyte %: 16.5 %
NEUTROS ABS: 2.7 x10 3/mm (ref 1.4–6.5)
Neutrophil %: 53.3 %
Platelet: 328 x10 3/mm (ref 150–440)
RBC: 3.57 10*6/uL — ABNORMAL LOW (ref 3.80–5.20)
RDW: 15.6 % — ABNORMAL HIGH (ref 11.5–14.5)
WBC: 5.1 x10 3/mm (ref 3.6–11.0)

## 2014-06-02 LAB — BASIC METABOLIC PANEL
Anion Gap: 10 (ref 7–16)
BUN: 9 mg/dL (ref 7–18)
CHLORIDE: 105 mmol/L (ref 98–107)
Calcium, Total: 9.6 mg/dL (ref 8.5–10.1)
Co2: 27 mmol/L (ref 21–32)
Creatinine: 0.93 mg/dL (ref 0.60–1.30)
EGFR (African American): >60
EGFR (Non-African Amer.): >60
Glucose: 79 mg/dL (ref 65–99)
Osmolality: 281 (ref 275–301)
Potassium: 3.9 mmol/L (ref 3.5–5.1)
Sodium: 142 mmol/L (ref 136–145)

## 2014-06-04 ENCOUNTER — Ambulatory Visit: Payer: Self-pay | Admitting: Oncology

## 2014-06-16 ENCOUNTER — Encounter: Payer: Self-pay | Admitting: General Surgery

## 2014-06-16 ENCOUNTER — Ambulatory Visit: Payer: Self-pay | Admitting: General Surgery

## 2014-06-16 LAB — COMPREHENSIVE METABOLIC PANEL
ALT: 45 U/L
ANION GAP: 10 (ref 7–16)
Albumin: 3.6 g/dL (ref 3.4–5.0)
Alkaline Phosphatase: 92 U/L
BUN: 11 mg/dL (ref 7–18)
Bilirubin,Total: 0.3 mg/dL (ref 0.2–1.0)
CALCIUM: 8.9 mg/dL (ref 8.5–10.1)
Chloride: 107 mmol/L (ref 98–107)
Co2: 26 mmol/L (ref 21–32)
Creatinine: 0.88 mg/dL (ref 0.60–1.30)
EGFR (African American): >60
EGFR (Non-African Amer.): >60
Glucose: 122 mg/dL — ABNORMAL HIGH (ref 65–99)
Osmolality: 286 (ref 275–301)
POTASSIUM: 4 mmol/L (ref 3.5–5.1)
SGOT(AST): 37 U/L (ref 15–37)
SODIUM: 143 mmol/L (ref 136–145)
TOTAL PROTEIN: 7.2 g/dL (ref 6.4–8.2)

## 2014-06-16 LAB — CBC CANCER CENTER
Basophil #: 0.1 x10 3/mm (ref 0.0–0.1)
Basophil %: 0.9 %
Eosinophil #: 0.3 x10 3/mm (ref 0.0–0.7)
Eosinophil %: 3.6 %
HCT: 38.2 % (ref 35.0–47.0)
HGB: 12.3 g/dL (ref 12.0–16.0)
Lymphocyte #: 0.9 x10 3/mm — ABNORMAL LOW (ref 1.0–3.6)
Lymphocyte %: 11.9 %
MCH: 35 pg — ABNORMAL HIGH (ref 26.0–34.0)
MCHC: 32.2 g/dL (ref 32.0–36.0)
MCV: 109 fL — ABNORMAL HIGH (ref 80–100)
MONO ABS: 0.7 x10 3/mm (ref 0.2–0.9)
Monocyte %: 9 %
NEUTROS ABS: 5.8 x10 3/mm (ref 1.4–6.5)
Neutrophil %: 74.6 %
PLATELETS: 222 x10 3/mm (ref 150–440)
RBC: 3.51 10*6/uL — ABNORMAL LOW (ref 3.80–5.20)
RDW: 13.3 % (ref 11.5–14.5)
WBC: 7.7 x10 3/mm (ref 3.6–11.0)

## 2014-06-23 LAB — CBC CANCER CENTER
BASOS PCT: 2 %
Basophil #: 0 x10 3/mm (ref 0.0–0.1)
EOS ABS: 0.1 x10 3/mm (ref 0.0–0.7)
Eosinophil %: 9.1 %
HCT: 39.8 % (ref 35.0–47.0)
HGB: 13.2 g/dL (ref 12.0–16.0)
LYMPHS ABS: 0.6 x10 3/mm — AB (ref 1.0–3.6)
LYMPHS PCT: 46.7 %
MCH: 35.3 pg — ABNORMAL HIGH (ref 26.0–34.0)
MCHC: 33.1 g/dL (ref 32.0–36.0)
MCV: 107 fL — AB (ref 80–100)
MONO ABS: 0.1 x10 3/mm — AB (ref 0.2–0.9)
Monocyte %: 11.2 %
NEUTROS ABS: 0.4 x10 3/mm — AB (ref 1.4–6.5)
NEUTROS PCT: 31 %
Platelet: 206 x10 3/mm (ref 150–440)
RBC: 3.72 10*6/uL — AB (ref 3.80–5.20)
RDW: 12.3 % (ref 11.5–14.5)
WBC: 1.2 x10 3/mm — CL (ref 3.6–11.0)

## 2014-06-23 LAB — BASIC METABOLIC PANEL
ANION GAP: 8 (ref 7–16)
BUN: 9 mg/dL (ref 7–18)
CALCIUM: 9.5 mg/dL (ref 8.5–10.1)
CO2: 29 mmol/L (ref 21–32)
CREATININE: 0.95 mg/dL (ref 0.60–1.30)
Chloride: 101 mmol/L (ref 98–107)
EGFR (African American): >60
EGFR (Non-African Amer.): >60
Glucose: 120 mg/dL — ABNORMAL HIGH (ref 65–99)
Osmolality: 276 (ref 275–301)
Potassium: 4.2 mmol/L (ref 3.5–5.1)
SODIUM: 138 mmol/L (ref 136–145)

## 2014-06-24 ENCOUNTER — Ambulatory Visit (INDEPENDENT_AMBULATORY_CARE_PROVIDER_SITE_OTHER): Payer: Medicaid Other | Admitting: General Surgery

## 2014-06-24 ENCOUNTER — Encounter: Payer: Self-pay | Admitting: General Surgery

## 2014-06-24 VITALS — BP 128/78 | HR 80 | Resp 12 | Ht 64.0 in | Wt 160.0 lb

## 2014-06-24 DIAGNOSIS — N6019 Diffuse cystic mastopathy of unspecified breast: Secondary | ICD-10-CM

## 2014-06-24 DIAGNOSIS — C3490 Malignant neoplasm of unspecified part of unspecified bronchus or lung: Secondary | ICD-10-CM | POA: Insufficient documentation

## 2014-06-24 DIAGNOSIS — Z803 Family history of malignant neoplasm of breast: Secondary | ICD-10-CM

## 2014-06-24 DIAGNOSIS — C3480 Malignant neoplasm of overlapping sites of unspecified bronchus and lung: Secondary | ICD-10-CM

## 2014-06-24 NOTE — Progress Notes (Signed)
Patient ID: Marcia Fuentes, female   DOB: 09-06-1955, 58 y.o.   MRN: [id number]  Chief Complaint  Patient presents with  . Follow-up    mammogram    HPI Marcia Fuentes is a 58 y.o. female who presents for a breast evaluation. The most recent mammogram was done on 06/16/14.  Patient does perform regular self breast checks and gets regular mammograms done. She is currently on Chemotherapy. She had 3 small tumors found in central lung area and is being treated with Taxotere for the next 3 months. The patient completed her initial chemo and radiation last year.   HPI  Past Medical History  Diagnosis Date  . Diffuse cystic mastopathy   . Cancer 03/2013    lung, Dr Grayland Ormond treating    Past Surgical History  Procedure Laterality Date  . Tubal ligation  1994  . Breast biopsy Left 2008  . Portacath placement    . Lung biopsy      Family History  Problem Relation Age of Onset  . Cancer Mother     breast  . Cancer Maternal Aunt     breast  . Cancer Maternal Grandmother     breast  . Cancer Maternal Aunt     breast    Social History History  Substance Use Topics  . Smoking status: Former Smoker -- 1.00 packs/day for 30 years  . Smokeless tobacco: Never Used  . Alcohol Use: No    Allergies  Allergen Reactions  . Latex Hives  . Sulfa Antibiotics     Cramps     Current Outpatient Prescriptions  Medication Sig Dispense Refill  . ranitidine (ZANTAC) 150 MG tablet Take 150 mg by mouth daily.       No current facility-administered medications for this visit.    Review of Systems Review of Systems  Constitutional: Negative.   Respiratory: Negative.   Cardiovascular: Negative.     Blood pressure 128/78, pulse 80, resp. rate 12, height 5\' 4"  (1.626 m), weight 160 lb (72.576 kg).  Physical Exam Physical Exam  Constitutional: She is oriented to person, place, and time. She appears well-developed and well-nourished.  Eyes: Pupils are equal, round, and reactive  to light. No scleral icterus.  Neck: Neck supple.  Cardiovascular: Normal rate, regular rhythm and normal heart sounds.   Pulmonary/Chest: Effort normal and breath sounds normal. Right breast exhibits no inverted nipple, no mass, no nipple discharge, no skin change and no tenderness. Left breast exhibits no inverted nipple, no mass, no nipple discharge, no skin change and no tenderness.  Abdominal: Normal appearance. There is no hepatosplenomegaly.  Lymphadenopathy:    She has no cervical adenopathy.    She has no axillary adenopathy.  Neurological: She is alert and oriented to person, place, and time.    Data Reviewed Mammogram reviewed and stable  Assessment    Stable exam. Lung Cancer ongoing treatment. FCD stable. FH of breast cancer- genetic testing may be of value for pt's daughter. Pt not sure if this was done at [facility]. Will discuss this with Dr. Grayland Ormond- pt's oncologist    Plan      33yr f/u with bil screening mammogram    SANKAR,SEEPLAPUTHUR G 06/25/2014, 1:35 PM

## 2014-06-24 NOTE — Patient Instructions (Signed)
Continue self breast exams. Call office for any new breast issues or concerns. 

## 2014-06-25 ENCOUNTER — Encounter: Payer: Self-pay | Admitting: General Surgery

## 2014-07-05 ENCOUNTER — Ambulatory Visit: Payer: Self-pay | Admitting: Oncology

## 2014-07-06 ENCOUNTER — Encounter: Payer: Self-pay | Admitting: General Surgery

## 2014-07-07 LAB — CBC CANCER CENTER
BASOS ABS: 0.1 x10 3/mm (ref 0.0–0.1)
Basophil %: 0.9 %
EOS PCT: 0.3 %
Eosinophil #: 0 x10 3/mm (ref 0.0–0.7)
HCT: 40.5 % (ref 35.0–47.0)
HGB: 13.4 g/dL (ref 12.0–16.0)
LYMPHS ABS: 0.9 x10 3/mm — AB (ref 1.0–3.6)
LYMPHS PCT: 11.8 %
MCH: 34.9 pg — ABNORMAL HIGH (ref 26.0–34.0)
MCHC: 33.1 g/dL (ref 32.0–36.0)
MCV: 106 fL — ABNORMAL HIGH (ref 80–100)
MONO ABS: 0.7 x10 3/mm (ref 0.2–0.9)
Monocyte %: 9.2 %
Neutrophil #: 6.2 x10 3/mm (ref 1.4–6.5)
Neutrophil %: 77.8 %
Platelet: 245 x10 3/mm (ref 150–440)
RBC: 3.83 10*6/uL (ref 3.80–5.20)
RDW: 12.6 % (ref 11.5–14.5)
WBC: 8 x10 3/mm (ref 3.6–11.0)

## 2014-07-07 LAB — COMPREHENSIVE METABOLIC PANEL
ANION GAP: 10 (ref 7–16)
AST: 30 U/L (ref 15–37)
Albumin: 3.5 g/dL (ref 3.4–5.0)
Alkaline Phosphatase: 93 U/L
BILIRUBIN TOTAL: 0.2 mg/dL (ref 0.2–1.0)
BUN: 8 mg/dL (ref 7–18)
CALCIUM: 9.2 mg/dL (ref 8.5–10.1)
CO2: 25 mmol/L (ref 21–32)
Chloride: 103 mmol/L (ref 98–107)
Creatinine: 1 mg/dL (ref 0.60–1.30)
EGFR (Non-African Amer.): >60
GLUCOSE: 152 mg/dL — AB (ref 65–99)
Osmolality: 277 (ref 275–301)
POTASSIUM: 3.7 mmol/L (ref 3.5–5.1)
SGPT (ALT): 40 U/L
SODIUM: 138 mmol/L (ref 136–145)
TOTAL PROTEIN: 7.3 g/dL (ref 6.4–8.2)

## 2014-07-28 LAB — CBC CANCER CENTER
Basophil #: 0.1 x10 3/mm (ref 0.0–0.1)
Basophil %: 1.2 %
EOS ABS: 0 x10 3/mm (ref 0.0–0.7)
EOS PCT: 0.3 %
HCT: 40.3 % (ref 35.0–47.0)
HGB: 13.3 g/dL (ref 12.0–16.0)
LYMPHS PCT: 11.1 %
Lymphocyte #: 0.8 x10 3/mm — ABNORMAL LOW (ref 1.0–3.6)
MCH: 34.6 pg — ABNORMAL HIGH (ref 26.0–34.0)
MCHC: 33 g/dL (ref 32.0–36.0)
MCV: 105 fL — ABNORMAL HIGH (ref 80–100)
MONO ABS: 0.7 x10 3/mm (ref 0.2–0.9)
Monocyte %: 9.8 %
Neutrophil #: 5.6 x10 3/mm (ref 1.4–6.5)
Neutrophil %: 77.6 %
Platelet: 269 x10 3/mm (ref 150–440)
RBC: 3.85 10*6/uL (ref 3.80–5.20)
RDW: 13 % (ref 11.5–14.5)
WBC: 7.2 x10 3/mm (ref 3.6–11.0)

## 2014-07-28 LAB — COMPREHENSIVE METABOLIC PANEL
ALK PHOS: 92 U/L
ALT: 45 U/L
Albumin: 3.5 g/dL (ref 3.4–5.0)
Anion Gap: 12 (ref 7–16)
BUN: 9 mg/dL (ref 7–18)
Bilirubin,Total: 0.3 mg/dL (ref 0.2–1.0)
CHLORIDE: 103 mmol/L (ref 98–107)
Calcium, Total: 9.2 mg/dL (ref 8.5–10.1)
Co2: 25 mmol/L (ref 21–32)
Creatinine: 0.89 mg/dL (ref 0.60–1.30)
EGFR (African American): >60
EGFR (Non-African Amer.): >60
Glucose: 118 mg/dL — ABNORMAL HIGH (ref 65–99)
Osmolality: 279 (ref 275–301)
Potassium: 3.9 mmol/L (ref 3.5–5.1)
SGOT(AST): 34 U/L (ref 15–37)
Sodium: 140 mmol/L (ref 136–145)
TOTAL PROTEIN: 7 g/dL (ref 6.4–8.2)

## 2014-08-04 ENCOUNTER — Ambulatory Visit: Payer: Self-pay | Admitting: Oncology

## 2014-08-18 LAB — COMPREHENSIVE METABOLIC PANEL
ALK PHOS: 85 U/L
AST: 23 U/L (ref 15–37)
Albumin: 3.3 g/dL — ABNORMAL LOW (ref 3.4–5.0)
Anion Gap: 8 (ref 7–16)
BUN: 6 mg/dL — ABNORMAL LOW (ref 7–18)
Bilirubin,Total: 0.3 mg/dL (ref 0.2–1.0)
Calcium, Total: 8.6 mg/dL (ref 8.5–10.1)
Chloride: 105 mmol/L (ref 98–107)
Co2: 28 mmol/L (ref 21–32)
Creatinine: 0.86 mg/dL (ref 0.60–1.30)
EGFR (Non-African Amer.): >60
Glucose: 109 mg/dL — ABNORMAL HIGH (ref 65–99)
OSMOLALITY: 279 (ref 275–301)
POTASSIUM: 3.8 mmol/L (ref 3.5–5.1)
SGPT (ALT): 42 U/L
Sodium: 141 mmol/L (ref 136–145)
TOTAL PROTEIN: 6.2 g/dL — AB (ref 6.4–8.2)

## 2014-08-18 LAB — CBC CANCER CENTER
BASOS PCT: 2.2 %
Basophil #: 0.1 x10 3/mm (ref 0.0–0.1)
EOS ABS: 0 x10 3/mm (ref 0.0–0.7)
EOS PCT: 0.2 %
HCT: 38.6 % (ref 35.0–47.0)
HGB: 12.8 g/dL (ref 12.0–16.0)
Lymphocyte #: 0.9 x10 3/mm — ABNORMAL LOW (ref 1.0–3.6)
Lymphocyte %: 17.3 %
MCH: 34 pg (ref 26.0–34.0)
MCHC: 33.2 g/dL (ref 32.0–36.0)
MCV: 103 fL — AB (ref 80–100)
Monocyte #: 0.8 x10 3/mm (ref 0.2–0.9)
Monocyte %: 14.9 %
NEUTROS PCT: 65.4 %
Neutrophil #: 3.5 x10 3/mm (ref 1.4–6.5)
Platelet: 238 x10 3/mm (ref 150–440)
RBC: 3.77 10*6/uL — AB (ref 3.80–5.20)
RDW: 13.4 % (ref 11.5–14.5)
WBC: 5.3 x10 3/mm (ref 3.6–11.0)

## 2014-09-04 ENCOUNTER — Ambulatory Visit: Payer: Self-pay | Admitting: Oncology

## 2014-09-15 LAB — CBC CANCER CENTER
Basophil #: 0.2 x10 3/mm — ABNORMAL HIGH (ref 0.0–0.1)
Basophil %: 3.2 %
EOS PCT: 1.9 %
Eosinophil #: 0.1 x10 3/mm (ref 0.0–0.7)
HCT: 41.1 % (ref 35.0–47.0)
HGB: 13.6 g/dL (ref 12.0–16.0)
LYMPHS ABS: 0.5 x10 3/mm — AB (ref 1.0–3.6)
Lymphocyte %: 7.7 %
MCH: 33.6 pg (ref 26.0–34.0)
MCHC: 33 g/dL (ref 32.0–36.0)
MCV: 102 fL — AB (ref 80–100)
MONOS PCT: 9.8 %
Monocyte #: 0.7 x10 3/mm (ref 0.2–0.9)
NEUTROS ABS: 5.2 x10 3/mm (ref 1.4–6.5)
Neutrophil %: 77.4 %
Platelet: 245 x10 3/mm (ref 150–440)
RBC: 4.03 10*6/uL (ref 3.80–5.20)
RDW: 13.8 % (ref 11.5–14.5)
WBC: 6.7 x10 3/mm (ref 3.6–11.0)

## 2014-09-15 LAB — COMPREHENSIVE METABOLIC PANEL
ALT: 39 U/L
Albumin: 3.1 g/dL — ABNORMAL LOW (ref 3.4–5.0)
Alkaline Phosphatase: 80 U/L
Anion Gap: 5 — ABNORMAL LOW (ref 7–16)
BUN: 9 mg/dL (ref 7–18)
Bilirubin,Total: 0.3 mg/dL (ref 0.2–1.0)
CREATININE: 0.81 mg/dL (ref 0.60–1.30)
Calcium, Total: 8.5 mg/dL (ref 8.5–10.1)
Chloride: 105 mmol/L (ref 98–107)
Co2: 29 mmol/L (ref 21–32)
EGFR (Non-African Amer.): >60
GLUCOSE: 103 mg/dL — AB (ref 65–99)
Osmolality: 276 (ref 275–301)
Potassium: 4.1 mmol/L (ref 3.5–5.1)
SGOT(AST): 28 U/L (ref 15–37)
Sodium: 139 mmol/L (ref 136–145)
TOTAL PROTEIN: 6.2 g/dL — AB (ref 6.4–8.2)

## 2014-10-02 IMAGING — CT CT CHEST W/ CM
2 of 3 series · 15 of 36 positions shown, 18 images · IV contrast (agent unspecified)
Comparison: PET of 02/18/2014.  Chest CT of 02/09/2014.

CLINICAL DATA: Small cell lung cancer. Recurrent. On chemotherapy.

EXAM:
CT CHEST WITH CONTRAST
TECHNIQUE: Multidetector CT imaging of the chest was performed during
intravenous contrast administration.
CONTRAST:  75 cc [id number]

[Series 2: routine chest with · axial · 0.61mm/px · z∈[-830,-584]mm · 12 of 59 slices shown, 15 images]
[im 5/59  mediastinal]
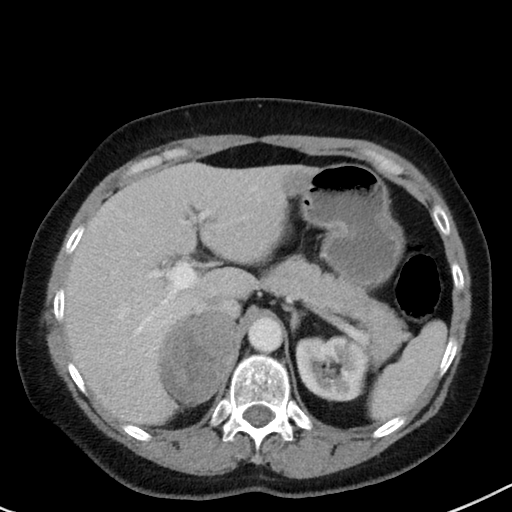
[im 5/59  lung]
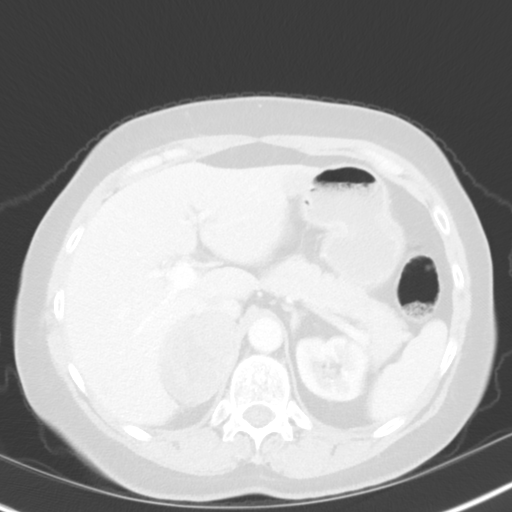
[im 9/59  lung]
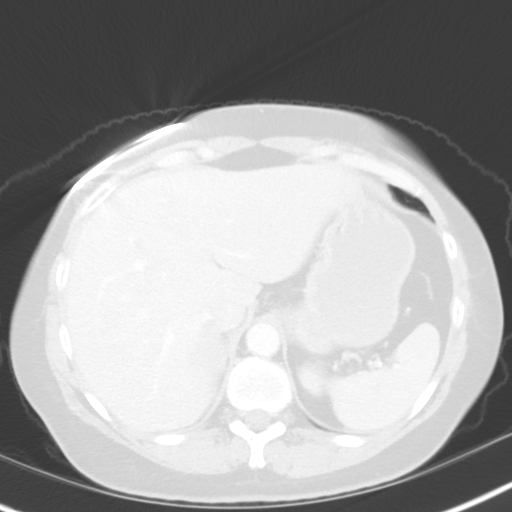
[im 13/59  lung]
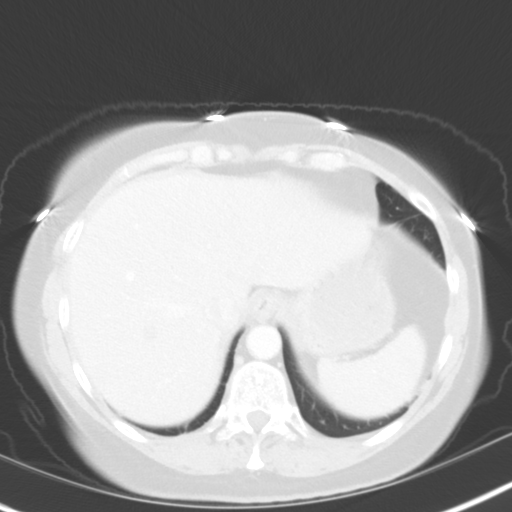
[im 18/59  lung]
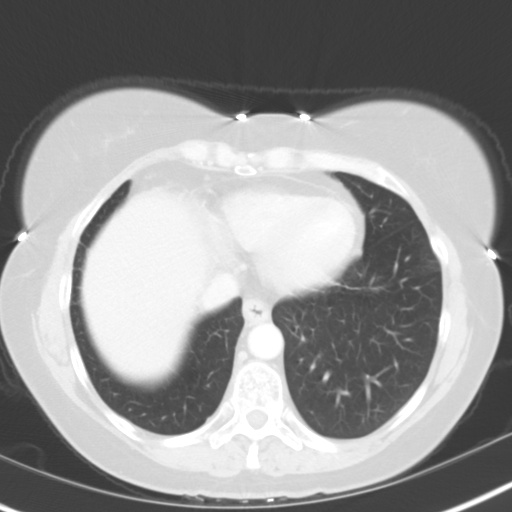
[im 22/59  mediastinal]
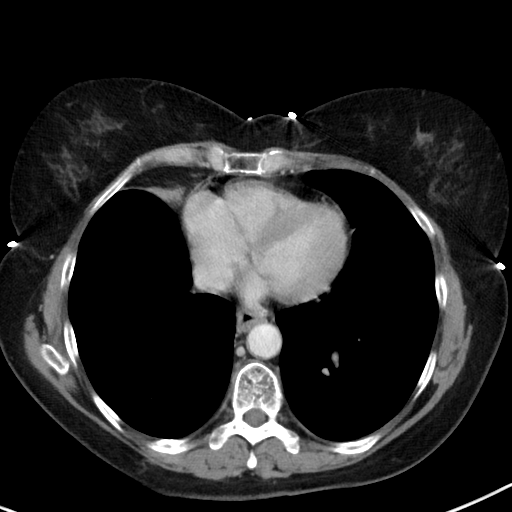
[im 22/59  lung]
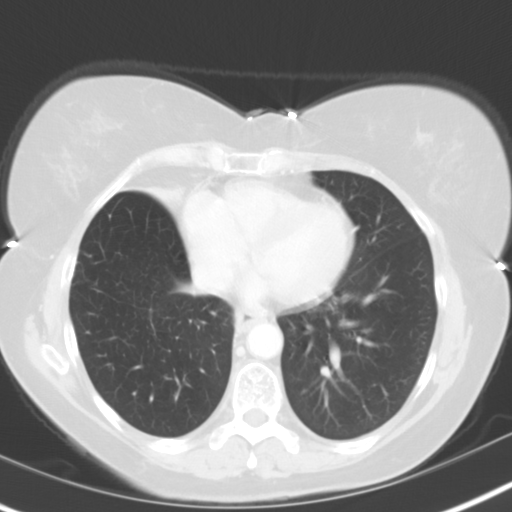
[im 26/59  lung]
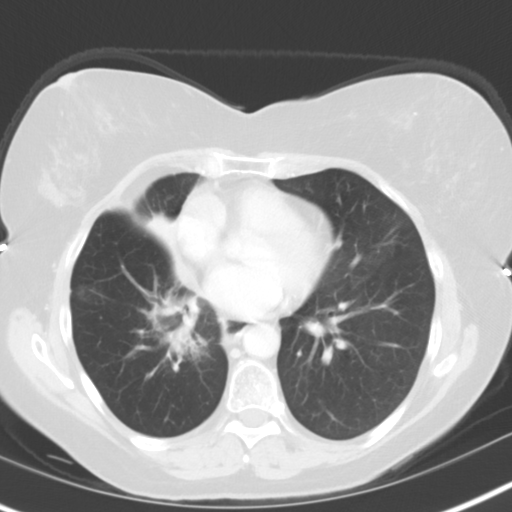
[im 33/59  lung]
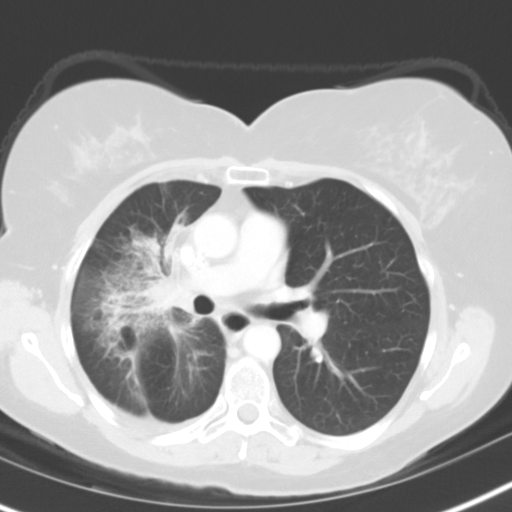
[im 37/59  lung]
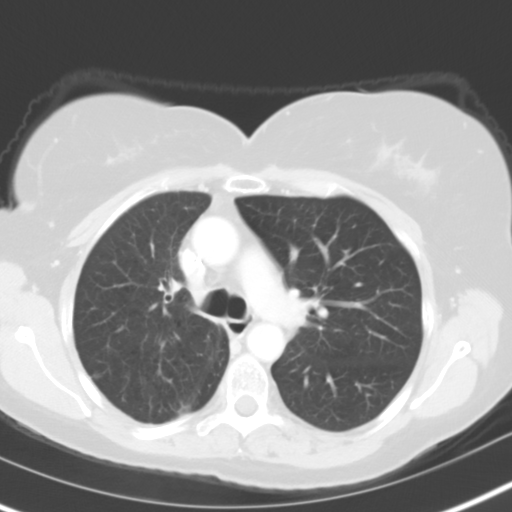
[im 41/59  mediastinal]
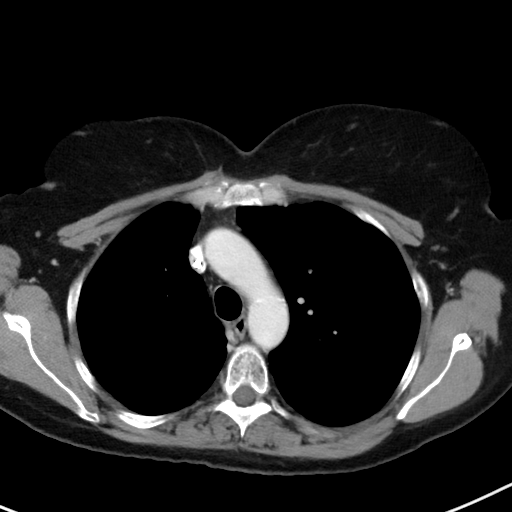
[im 41/59  lung]
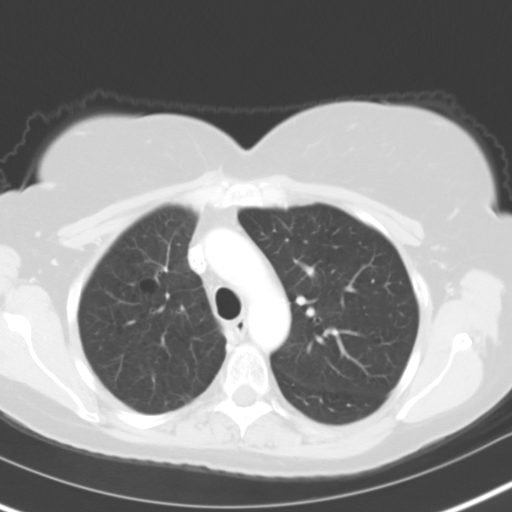
[im 46/59  lung]
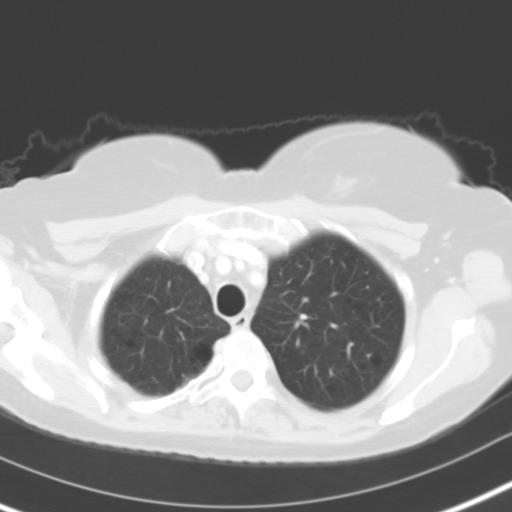
[im 50/59  lung]
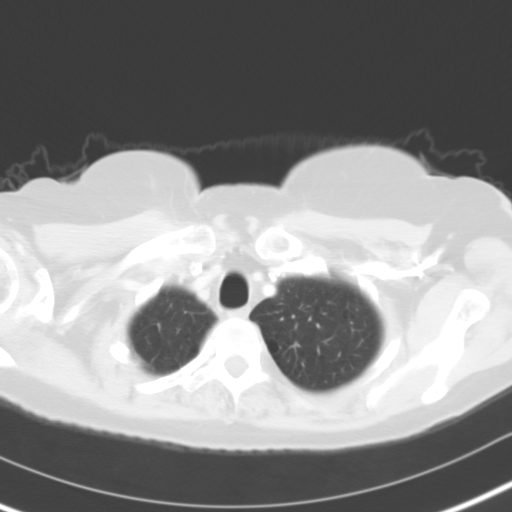
[im 54/59  lung]
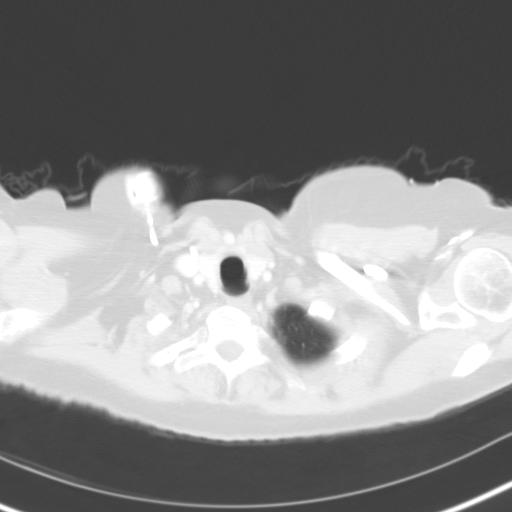

[Series 5: cor routine chest with · coronal · 0.59mm/px · 3 of 127 slices shown]
[im 26/127  lung]
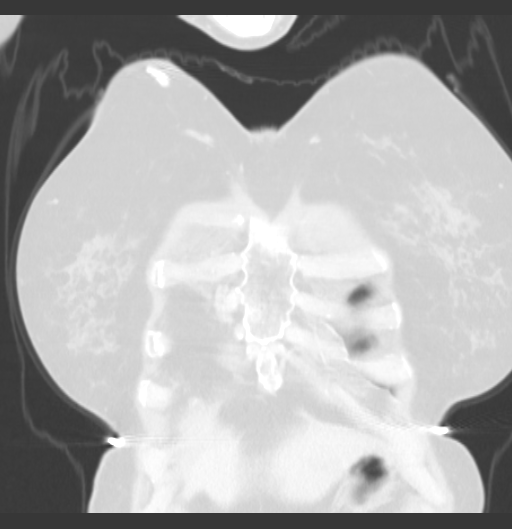
[im 51/127  lung]
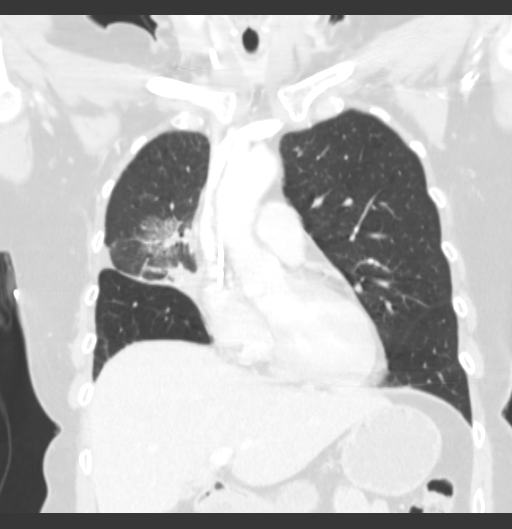
[im 76/127  lung]
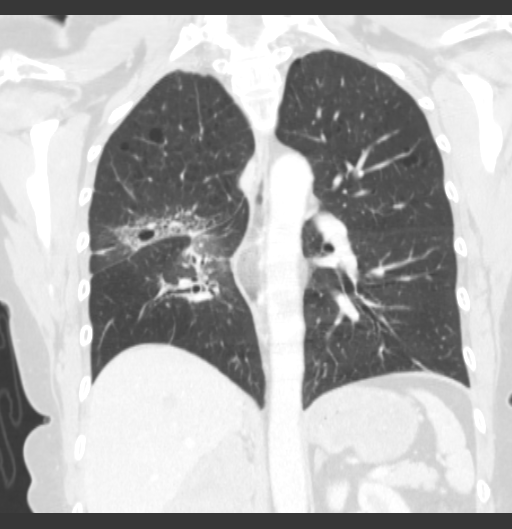

[15 of 36 positions shown; findings below may reference images not displayed]

FINDINGS: Lungs/Pleura: Centrilobular emphysema, moderate. Progressive mass
effect upon the right middle lobe bronchus, which is now obstructed
on image 31 of series 3.

Progressive right perihilar ground-glass opacification with septal
thickening. Likely radiation induced. Progressive partial collapse
of the right middle lobe, including on image 33 of series 3.

No pleural fluid.

Heart/Mediastinum: No supraclavicular adenopathy. Right-sided
Port-A-Cath which terminates at the high right atrium on this supine
exam.

Aortic and branch vessel atherosclerosis. Normal heart size, without
pericardial effusion. No central pulmonary embolism, on this
non-dedicated study.

Nodal mass or masses in the right hilar and infrahilar region are
slightly increased. The more medial portion measures 2.2 x 1.9 cm on
image 30 versus 1.5 x 1.9 cm on 02/09/2014. The more lateral
portion, which now contacts the more medial portion, measures 2.0 x
2.3 cm on image 29 today versus 1.4 by 1.4 cm on 02/06/2014. No
mediastinal or left hilar adenopathy.

Small right cardiophrenic angle nodes are nonspecific.

Mildly dilated esophagus with fluid level within.

Upper Abdomen: Right hepatic lobe mildly hypo attenuating lesion is
similar at 1.0 cm on image 46. A subcapsular left hepatic lobe
low-density lesion measures 1.9 cm and is been similar back to
11/05/2013. No change in a perfusion anomaly or flash fill
hemangioma in the right lobe of the liver.

Normal imaged portions of the spleen, stomach, pancreas, left
adrenal gland, and left kidney. A right adrenal metastasis is
incompletely imaged but progressive. Measures 5.5 x 4.4 cm on image
56 versus 2.3 x 2.5 cm on 02/09/2014.

Bones/Musculoskeletal:  No acute osseous abnormality.
IMPRESSION: 1. Mild progression of nodal recurrence within the right hilum and
infrahilar region. This causes obstruction of the right middle lobe
bronchus and progressive volume loss/ collapse.
2. Incompletely imaged but markedly enlarged right adrenal
metastasis.
3. No new areas of metastatic disease.
4. Liver lesions which remain indeterminate. Given ongoing
stability, favored to represent benign entities.

## 2014-10-05 ENCOUNTER — Ambulatory Visit: Payer: Self-pay | Admitting: Oncology

## 2014-10-06 LAB — COMPREHENSIVE METABOLIC PANEL
ALK PHOS: 82 U/L (ref 46–116)
AST: 26 U/L (ref 15–37)
Albumin: 3.5 g/dL (ref 3.4–5.0)
Anion Gap: 11 (ref 7–16)
BUN: 9 mg/dL (ref 7–18)
Bilirubin,Total: 0.4 mg/dL (ref 0.2–1.0)
CALCIUM: 9 mg/dL (ref 8.5–10.1)
CHLORIDE: 102 mmol/L (ref 98–107)
CO2: 27 mmol/L (ref 21–32)
Creatinine: 0.83 mg/dL (ref 0.60–1.30)
EGFR (African American): >60
EGFR (Non-African Amer.): >60
GLUCOSE: 96 mg/dL (ref 65–99)
Osmolality: 278 (ref 275–301)
POTASSIUM: 3.7 mmol/L (ref 3.5–5.1)
SGPT (ALT): 36 U/L (ref 14–63)
SODIUM: 140 mmol/L (ref 136–145)
Total Protein: 6.9 g/dL (ref 6.4–8.2)

## 2014-10-06 LAB — CBC CANCER CENTER
Basophil #: 0.1 x10 3/mm (ref 0.0–0.1)
Basophil %: 1.1 %
EOS PCT: 1.8 %
Eosinophil #: 0.1 x10 3/mm (ref 0.0–0.7)
HCT: 40.8 % (ref 35.0–47.0)
HGB: 14 g/dL (ref 12.0–16.0)
LYMPHS PCT: 15.4 %
Lymphocyte #: 0.8 x10 3/mm — ABNORMAL LOW (ref 1.0–3.6)
MCH: 34.2 pg — ABNORMAL HIGH (ref 26.0–34.0)
MCHC: 34.3 g/dL (ref 32.0–36.0)
MCV: 100 fL (ref 80–100)
MONOS PCT: 8.7 %
Monocyte #: 0.4 x10 3/mm (ref 0.2–0.9)
Neutrophil #: 3.7 x10 3/mm (ref 1.4–6.5)
Neutrophil %: 73 %
Platelet: 207 x10 3/mm (ref 150–440)
RBC: 4.09 10*6/uL (ref 3.80–5.20)
RDW: 13 % (ref 11.5–14.5)
WBC: 5.1 x10 3/mm (ref 3.6–11.0)

## 2014-11-03 ENCOUNTER — Ambulatory Visit
Admit: 2014-11-03 | Disposition: A | Discharge status: Home / Self Care | Payer: Self-pay | Attending: Oncology | Admitting: Oncology

## 2014-11-28 ENCOUNTER — Inpatient Hospital Stay: Payer: Self-pay | Admitting: Internal Medicine

## 2014-11-28 LAB — CBC WITH DIFFERENTIAL/PLATELET
HCT: 35.6 % (ref 35.0–47.0)
HGB: 12.3 g/dL (ref 12.0–16.0)
LYMPHS PCT: 17 %
MCH: 34.2 pg — ABNORMAL HIGH (ref 26.0–34.0)
MCHC: 34.4 g/dL (ref 32.0–36.0)
MCV: 99 fL (ref 80–100)
Monocytes: 2 %
PLATELETS: 151 10*3/uL (ref 150–440)
RBC: 3.59 10*6/uL — ABNORMAL LOW (ref 3.80–5.20)
RDW: 13.6 % (ref 11.5–14.5)
Segmented Neutrophils: 80 %
VARIANT LYMPHOCYTE - H1-RLYMPH: 1 %
WBC: 3.5 10*3/uL — ABNORMAL LOW (ref 3.6–11.0)

## 2014-11-28 LAB — APTT: ACTIVATED PTT: 35.4 s (ref 23.6–35.9)

## 2014-11-28 LAB — BASIC METABOLIC PANEL
Anion Gap: 9 (ref 7–16)
BUN: 10 mg/dL
CALCIUM: 8.4 mg/dL — AB
CHLORIDE: 97 mmol/L — AB
CO2: 25 mmol/L
Creatinine: 0.8 mg/dL
EGFR (African American): >60
Glucose: 135 mg/dL — ABNORMAL HIGH
POTASSIUM: 3.8 mmol/L
Sodium: 131 mmol/L — ABNORMAL LOW

## 2014-11-28 LAB — TROPONIN I: Troponin-I: <0.03 ng/mL

## 2014-11-28 LAB — CK TOTAL AND CKMB (NOT AT ARMC)
CK, Total: 91 U/L
CK-MB: 2.4 ng/mL

## 2014-11-28 LAB — SODIUM, URINE, RANDOM: Sodium, Urine Random: 67 mmol/L (ref 20–110)

## 2014-11-28 LAB — PROTIME-INR
INR: 1.1
PROTHROMBIN TIME: 13.9 s

## 2014-11-29 DIAGNOSIS — E871 Hypo-osmolality and hyponatremia: Secondary | ICD-10-CM

## 2014-11-29 DIAGNOSIS — J9620 Acute and chronic respiratory failure, unspecified whether with hypoxia or hypercapnia: Secondary | ICD-10-CM

## 2014-11-29 DIAGNOSIS — I495 Sick sinus syndrome: Secondary | ICD-10-CM

## 2014-11-29 DIAGNOSIS — R0602 Shortness of breath: Secondary | ICD-10-CM

## 2014-11-29 LAB — BASIC METABOLIC PANEL
ANION GAP: 7 (ref 7–16)
BUN: 8 mg/dL
CO2: 26 mmol/L
Calcium, Total: 8.3 mg/dL — ABNORMAL LOW
Chloride: 104 mmol/L
Creatinine: 0.65 mg/dL
Glucose: 122 mg/dL — ABNORMAL HIGH
Potassium: 3.7 mmol/L
Sodium: 137 mmol/L

## 2014-11-29 LAB — CBC WITH DIFFERENTIAL/PLATELET
Basophil #: 0 10*3/uL (ref 0.0–0.1)
Basophil %: 1.5 %
EOS ABS: 0 10*3/uL (ref 0.0–0.7)
Eosinophil %: 1.1 %
HCT: 32.1 % — AB (ref 35.0–47.0)
HGB: 10.7 g/dL — ABNORMAL LOW (ref 12.0–16.0)
Lymphocyte #: 0.5 10*3/uL — ABNORMAL LOW (ref 1.0–3.6)
Lymphocyte %: 21.9 %
MCH: 33.3 pg (ref 26.0–34.0)
MCHC: 33.3 g/dL (ref 32.0–36.0)
MCV: 100 fL (ref 80–100)
MONO ABS: 0.2 x10 3/mm (ref 0.2–0.9)
Monocyte %: 7.1 %
Neutrophil #: 1.6 10*3/uL (ref 1.4–6.5)
Neutrophil %: 68.4 %
PLATELETS: 138 10*3/uL — AB (ref 150–440)
RBC: 3.21 10*6/uL — AB (ref 3.80–5.20)
RDW: 13.7 % (ref 11.5–14.5)
WBC: 2.3 10*3/uL — AB (ref 3.6–11.0)

## 2014-11-29 LAB — OSMOLALITY, URINE: Osmolality: 306 mOsm/kg

## 2014-11-30 ENCOUNTER — Encounter: Payer: Self-pay | Admitting: Cardiovascular Disease

## 2014-11-30 DIAGNOSIS — J9620 Acute and chronic respiratory failure, unspecified whether with hypoxia or hypercapnia: Secondary | ICD-10-CM

## 2014-12-01 LAB — CBC WITH DIFFERENTIAL/PLATELET
BANDS NEUTROPHIL: 36 %
HCT: 32.3 % — ABNORMAL LOW (ref 35.0–47.0)
HGB: 10.8 g/dL — AB (ref 12.0–16.0)
Lymphocytes: 5 %
MCH: 33.6 pg (ref 26.0–34.0)
MCHC: 33.5 g/dL (ref 32.0–36.0)
MCV: 100 fL (ref 80–100)
METAMYELOCYTE: 4 %
Monocytes: 3 %
Myelocyte: 2 %
NRBC/100 WBC: 1 /
Platelet: 181 10*3/uL (ref 150–440)
RBC: 3.22 10*6/uL — ABNORMAL LOW (ref 3.80–5.20)
RDW: 13.6 % (ref 11.5–14.5)
SEGMENTED NEUTROPHILS: 50 %
WBC: 15 10*3/uL — AB (ref 3.6–11.0)

## 2014-12-01 LAB — BASIC METABOLIC PANEL
Anion Gap: 9 (ref 7–16)
BUN: 17 mg/dL
CHLORIDE: 102 mmol/L
CO2: 29 mmol/L
CREATININE: 0.68 mg/dL
Calcium, Total: 9 mg/dL
EGFR (African American): >60
EGFR (Non-African Amer.): >60
Glucose: 133 mg/dL — ABNORMAL HIGH
Potassium: 3.8 mmol/L
SODIUM: 140 mmol/L

## 2014-12-01 LAB — MAGNESIUM: Magnesium: 2.1 mg/dL

## 2014-12-02 ENCOUNTER — Encounter: Payer: Self-pay | Admitting: Physician Assistant

## 2014-12-04 ENCOUNTER — Ambulatory Visit
Admit: 2014-12-04 | Disposition: A | Discharge status: Home / Self Care | Payer: Self-pay | Attending: Oncology | Admitting: Oncology

## 2014-12-15 ENCOUNTER — Encounter: Payer: Self-pay | Admitting: Family Medicine

## 2014-12-15 LAB — CBC CANCER CENTER
BASOS PCT: 0.9 %
Basophil #: 0.1 x10 3/mm (ref 0.0–0.1)
EOS ABS: 0 x10 3/mm (ref 0.0–0.7)
Eosinophil %: 0.2 %
HCT: 35.4 % (ref 35.0–47.0)
HGB: 11.7 g/dL — ABNORMAL LOW (ref 12.0–16.0)
Lymphocyte #: 0.9 x10 3/mm — ABNORMAL LOW (ref 1.0–3.6)
Lymphocyte %: 9.8 %
MCH: 33.7 pg (ref 26.0–34.0)
MCHC: 33 g/dL (ref 32.0–36.0)
MCV: 102 fL — ABNORMAL HIGH (ref 80–100)
MONO ABS: 0.7 x10 3/mm (ref 0.2–0.9)
Monocyte %: 8 %
NEUTROS PCT: 81.1 %
Neutrophil #: 7.4 x10 3/mm — ABNORMAL HIGH (ref 1.4–6.5)
Platelet: 314 x10 3/mm (ref 150–440)
RBC: 3.47 10*6/uL — AB (ref 3.80–5.20)
RDW: 16.6 % — AB (ref 11.5–14.5)
WBC: 9.1 x10 3/mm (ref 3.6–11.0)

## 2014-12-15 LAB — COMPREHENSIVE METABOLIC PANEL
ALK PHOS: 94 U/L
ALT: 27 U/L
Albumin: 3.1 g/dL — ABNORMAL LOW
Anion Gap: 7 (ref 7–16)
BILIRUBIN TOTAL: 0.6 mg/dL
BUN: 7 mg/dL
CALCIUM: 8.9 mg/dL
CREATININE: 0.65 mg/dL
Chloride: 102 mmol/L
Co2: 29 mmol/L
EGFR (African American): >60
EGFR (Non-African Amer.): >60
GLUCOSE: 85 mg/dL
Potassium: 4.1 mmol/L
SGOT(AST): 25 U/L
Sodium: 138 mmol/L
Total Protein: 6.4 g/dL — ABNORMAL LOW

## 2014-12-25 NOTE — Op Note (Signed)
PATIENT NAME:  Marcia Fuentes, BOLDUC MR#:  [id number] DATE OF BIRTH:  1956-01-27  DATE OF PROCEDURE:  04/16/2013  PREOPERATIVE DIAGNOSIS:  Lung cancer with poor venous access.  POSTOPERATIVE DIAGNOSIS:  Lung cancer with poor venous access.  PROCEDURES:  1.  Ultrasound guidance for vascular access left internal jugular vein.  2.  Fluoroscopic guidance for placement of catheter.  3.  Placement of CT compatible Port-A-Cath left internal jugular vein.   SURGEON:  Leotis Pain, MD   ANESTHESIA:  Local.  FLUOROSCOPY TIME:  Less than 1 minute.   CONTRAST:  Zero.   ESTIMATED BLOOD LOSS:  25 mL   INDICATION FOR PROCEDURE:  This is a female with recently diagnosed lung cancer.  She begins chemotherapy next week and needs a Port-A-Cath for chemotherapy delivery and durable venous access.  Risks and benefits were discussed. Informed consent was obtained.   DESCRIPTION OF THE PROCEDURE:  The patient was brought to the vascular and interventional radiology suite. The left neck and chest were sterilely prepped and draped, and a sterile surgical field was created. Ultrasound was used to help visualize a patent left internal jugular vein. This was then accessed under direct ultrasound guidance without difficulty with a Seldinger needle and a permanent image was recorded. A J-wire was placed. After skin nick and dilatation, the peel-away sheath was then placed over the wire. I then anesthetized an area under the clavicle approximately 2 fingerbreadths. A transverse incision was created and an inferior pocket was created with electrocautery and blunt dissection. The port was then brought onto the field, placed into the pocket and secured to the chest wall with 2 Prolene sutures. The catheter was connected to the port and tunneled from the subclavicular incision to the access site. Fluoroscopic guidance was used to cut the catheter to an appropriate length. The catheter was then placed through the peel-away sheath and  the peel-away sheath was removed. The catheter tip was parked in excellent location in the cavoatrial junction. The pocket was then irrigated with antibiotic-impregnated saline and the wound was closed with a running 3-0 Vicryl and a 4-0 Monocryl. The access incision was closed with a single 4-0 Monocryl. The Huber needle was used to withdraw blood and flush the port with heparinized saline. Dermabond was then placed as a dressing. The patient tolerated the procedure well and was taken to the recovery room in stable condition.    ____________________________ Algernon Huxley, MD jsd:ce D: 04/16/2013 12:56:44 ET T: 04/16/2013 13:31:24 ET JOB#: [id number]  cc: Algernon Huxley, MD, <Dictator> Algernon Huxley MD ELECTRONICALLY SIGNED 04/21/2013 16:07

## 2014-12-25 NOTE — Consult Note (Signed)
Reason for Visit: This 59 year old Female patient presents to the clinic for initial evaluation of  lung cancer .   Referred by Dr. Grayland Ormond.  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 Underwent chest x-ray which was abnormal showing a right hilar mass confirmed on CT scan. PET scan was performed showing hypermetabolic activity in the right hilum invading the mediastinum. There was marked compression the right mainstem take off. Patient underwent bronchoscopy showingThere are findings concerning for   post obstructive collapse of the medial segment of the right middle lobe   versus possible invasion.cytology was positive for small cell undifferentiated carcinoma. At this time brain MRI scan has been ordered to complete her staging workup. She seen today for evaluation for concurrent chemoradiation. She's doing fairly well. Has had occasional trace hemoptysis. Continues to have some marked dyspnea on exertion and shortness of breath. She otherwise is in excellent overall performance status.  Past, Family and Social History:  Past Medical History noncontributory   Family History noncontributory   Social History positive   Social History Comments 30-pack-year smoking history has quit smoking   Additional Past Medical and Surgical History accompanied by multiple family members today   Allergies:   Sulfa drugs: Unknown  Home Meds:  Home Medications: Medication Instructions Status  Tessalon Perles 100 mg capsule 1 cap(s) orally 3 times  a day x 10 days  Active  acetaminophen-oxyCODONE 325 mg-7.5 mg oral tablet 1 tab(s) orally every 6 hours, As Needed - for Pain Active   Review of Systems:  General negative   Performance Status (ECOG) 0   Skin negative   Breast negative   Ophthalmologic negative   ENMT negative   Respiratory and Thorax negative   Cardiovascular negative   Gastrointestinal negative   Genitourinary negative   Musculoskeletal negative   Neurological negative   Psychiatric negative   Hematology/Lymphatics negative   Endocrine negative   Allergic/Immunologic negative   Nursing Notes:  Nursing Vital Signs and Chemo Nursing Nursing Notes: *CC Vital Signs Flowsheet:   07-Aug-14 14:48  Temp Temperature 98  Pulse Pulse 113  Respirations Respirations 22  SBP SBP 139  DBP DBP 80  Pain Scale (0-10)  3  Pulse Oxi  95  Current Weight (kg) (kg) 55.9  Height (cm) centimeters 165  BSA (m2) 1.6   Physical Exam:  General/Skin/HEENT:  General normal   Skin normal   Eyes normal   ENMT normal   Head and Neck normal   Additional PE well-developed well-nourished female in NAD. No cervical or supraclavicular adenopathy is appreciated. Lungs are clear to A&P cardiac examination shows regular rate and rhythm abdomen is benign with no organomegaly or masses noted. No evidence of facial plethora or collateral vessels of the anterior chest or venous jugular distention is identified.   Breasts/Resp/CV/GI/GU:  Respiratory and Thorax normal   Cardiovascular normal   Gastrointestinal normal   Genitourinary normal   MS/Neuro/Psych/Lymph:  Musculoskeletal normal   Neurological normal   Lymphatics normal   Other Results:  Radiology Results: LabUnknown:  29-Jul-14 10:41, CT Chest With Contrast  PACS Image     06-Aug-14 09:31, PET/CT Scan Lung Cancer Initial Staging  PACS Image   CT:    29-Jul-14 10:41, CT Chest With Contrast  CT Chest With Contrast   REASON FOR EXAM:     Abnormal chest  XR  COMMENTS:       PROCEDURE: KCT - KCT CHEST WITH CONTRAST  - Apr 01 2013 10:41AM     RESULT:     Technique: Helical 3 mm sections were obtained from the thoracic inlet   through the lung bases status post intravenous administration of 75 ml of   Isovue-370.    Findings:  A 5.57 x 6.26 cm, enhancing mass is appreciated within the   right hilar region. There is mediastinal extension with findings   concerning for invasion of the left atrium. The mass surroundsthe right   lower lobe bronchus and there is evidence of narrowing with near complete     obstruction distally. The mass also surrounds the bronchus intermedius as   well as the right middle lobe bronchus with findings concerning for mild   compression and possible invasion of the right middle lobe bronchus. An   area of consolidative lung projects within the medial segment of the   right middle lobe. This area is concerning for invasion though edematous   drowned lung is also of diagnostic consideration. A small, right pleural   effusion is appreciated. There is diffuse ground-glass density within the   right lower lobe as well as thickening of the interstitial markings.   These findings may represent a component of post obstructive pneumonitis   though lymphangitic extension of neoplasia is also of concern. A   spiculated mass projects within the posterior medial basal segment of the   right lower lobe, image #74 of the lung series, measuring 1.23 x 1.14 cm.     The left hemithorax is unremarkable.  The visualized upper abdominal viscera demonstrate no gross abnormalities.    IMPRESSION:      1.  Findings consistent with invasive neoplastic disease with the right   hilar region until proven otherwise. There are findings concerning for   post obstructive collapse of the medial segment of the right middle lobe   versus possible invasion.  2.  Findings concerning for post obstructive pneumonitis versus    lymphangitic spread of disease within the right lower lobe. Oncology   consultation is recommended.      Thank you for this opportunity to contribute to the care of your patient.     Verified By: Mikki Santee, M.D., MD  Nuclear Med:    06-Aug-14 09:31, PET/CT Scan Lung Cancer Initial Staging  PET/CT Scan Lung Cancer Initial Staging   REASON FOR EXAM:    Lung Cancer Staging  COMMENTS:       PROCEDURE: PET - PET/CT INIT STAGING LUNG CA  - Apr 09 2013  9:31AM     RESULT: The patient has a fasting blood glucose level measuring 97 mg/dL.   The patient received a dose of 12.99 mCi of fluorine 18 labeled   fluorodeoxyglucose in the left antecubital region at 8:08 a.m. with   imaging obtained from the base of the brain into the thighs between the   hours of 9:08 a.m. and 9:29 a.m. Low-dose noncontrast CT is performed the   same levels for the purposes of attenuation correction and fusion. The   low-dose noncontrast CT, attenuation corrected at  images and fused PET CT   data are reconstructed in the axial, coronal and sagittal planes by the   [employer] software which also creates a rotating 3-dimensional PET image.   The patient has no previous similar exam for comparison.  The study shows an abnormal area of tracer accumulation in the right   hilar region and infrahilar region were there is a large soft tissue mass   causing airway narrowing and postobstructive atelectasis in the right   lower lobe. Maximum SUV in this region is 14.28 with a mean of 8.9.   Bronchoscopic evaluation is suggested. There is increased localization   along the pleura in multiple areas. This is in the right hemithorax. One   of the more prominent regions is seen in the upper posterior right   hemithorax with a maximum SUV of 3.26 and a mean of 2.12. Pleural   implants are not excluded. Areas of pleuritic inflammation also   differential consideration. No definite adrenal abnormality is evident.   No  adrenal mass is evident. Study otherwise shows grossly normal tracer   accumulation.    IMPRESSION:   1. Large soft tissue mass in the right hilar to infrahilar region with     uptake diffusely. There is airway obstruction in the right lower lobe   bronchus. There is right pleural effusion with some pleural thickening   and scattered nodular areas of soft tissue density and uptake along the   pleural surface which could represent pleural involvement for malignant   disease.    Dictation Site: 1        Verified By: Sundra Aland, M.D., MD   Relevent Results:   Relevant Scans and Labs PET/CT scan and CT scans are reviewed   Assessment and Plan: Impression:   limited stage small cell lung cancer in 59 year old female. Plan:   I discussed the case personally with Dr. Grayland Ormond. Port-A-Cath will be ordered as well as MRI scan of her brain to complete her staging workup. At this point we'll go ahead with concurrent chemoradiation.we'll treat her chest to 6000 cGy using IMRT treatment planning and delivery. Would use IMRT treatment to spare her esophagus, spinal cord and normal lung volume. Risks and benefits of treatment including increasing cough possible dysphagia from esophagitis, fatigue, alteration blood counts, and skin reaction were all explained in detail to the patient and her family. I have set her up for CT simulation after she returns from vacation next week.  I would like to take this opportunity to thank you for allowing me to continue to participate in this patient's care.  CC Referral:  cc: Dr. Lelon Huh   Electronic Signatures: Baruch Gouty Roda Shutters (MD)  (Signed 07-Aug-14 15:38)  Authored: HPI, Diagnosis, PFSH, Allergies, Home Meds, ROS, Nursing Notes, Physical Exam, Other Results, Relevent Results, Encounter Assessment and Plan, CC Referring Physician   Last Updated: 07-Aug-14 15:38 by Armstead Peaks (MD)

## 2014-12-26 NOTE — Op Note (Signed)
PATIENT NAME:  Marcia Fuentes, Marcia Fuentes MR#:  [id number] DATE OF BIRTH:  02-07-1956  DATE OF PROCEDURE:  11/11/2013  PREOPERATIVE DIAGNOSIS: Lung carcinoma.   POSTOPERATIVE DIAGNOSIS: Lung carcinoma.   PROCEDURE PERFORMED: Removal of left internal jugular Infuse-a-Port.   SURGEON: Hortencia Pilar, M.D.   SEDATION: Versed 4 mg plus fentanyl 200 mcg administered IV. Continuous ECG, pulse oximetry and cardiopulmonary monitoring is performed throughout the entire procedure by the interventional radiology nurse.   TOTAL SEDATION TIME: 30 minutes.   ACCESS: Right IJ.   FLUOROSCOPY TIME: None.   CONTRAST USED: None.   INDICATIONS: [name] is a 59 year old woman who has completed her chemotherapy and no longer needs her Infuse-a-Port. She is therefore having it removed. The risks and benefits were reviewed and the patient has agreed to proceed.   DESCRIPTION OF PROCEDURE: The patient is taken to special procedures and placed in the supine position. After adequate sedation is achieved, her chest and neck are prepped and draped in sterile fashion. Appropriate timeout is called.   Lidocaine 1% with epinephrine is infiltrated in the soft tissues surrounding the port, and the previous incisional scar is re-incised with an 11-blade scalpel. Dissection is carried down to expose the port, which is freed from the surrounding tissues. Two Prolene sutures are identified and these are cut and then removed. The port is then easily slipped out of the pocket. The intravenous portion of the catheter is then removed without difficulty. The port is intact upon removal.   Light pressure was held at the base of the neck for several minutes and subsequently the pocket is irrigated and closed using interrupted 3-0 Vicryl followed by 4-0 Monocryl subcuticular and then Dermabond. The patient tolerated the procedure well and there were no immediate complications.    ____________________________ Katha Cabal,  MD ggs:np D: 11/11/2013 16:26:00 ET T: 11/11/2013 16:42:39 ET JOB#: [id number]  cc: Katha Cabal, MD, <Dictator> Kirstie Peri. Caryn Section, MD Kathlene November. Grayland Ormond, [place] MD ELECTRONICALLY SIGNED 11/25/2013 18:46

## 2014-12-26 NOTE — Op Note (Signed)
PATIENT NAME:  Marcia Fuentes, Marcia Fuentes MR#:  [id number] DATE OF BIRTH:  1955/09/13  DATE OF PROCEDURE:  03/04/2014  PREOPERATIVE DIAGNOSIS:  Lung cancer with poor venous access.   POSTOPERATIVE DIAGNOSIS:  Lung cancer with poor venous access.   PROCEDURES:  1.  Ultrasound guidance for vascular access, right internal jugular vein.  2.  Fluoroscopic guidance for placement of catheter.  3.  Placement of CT compatible Port-A-Cath, right internal jugular vein.   SURGEON:  Algernon Huxley, MD   ANESTHESIA:  Local with moderate conscious sedation.   FLUOROSCOPY TIME:  Less than 1 minute.   CONTRAST:  Zero.   ESTIMATED BLOOD LOSS:  25 mL.  INDICATION FOR PROCEDURE:  This is a 59 year old white female with recurrent lung cancer. She has previously had a Port-A-Cath placed and taken out when she completed her chemotherapy earlier this year. She now needs to resume chemotherapy and needs a durable venous access device. Risks and benefits were discussed. Informed consent was obtained.   DESCRIPTION OF THE PROCEDURE:  The patient was brought to the vascular and interventional radiology suite. The right neck and chest were sterilely prepped and draped, and a sterile surgical field was created. Ultrasound was used to help visualize a patent right internal jugular vein. This was then accessed under direct ultrasound guidance without difficulty with a Seldinger needle and a permanent image was recorded. A J-wire was placed. After skin nick and dilatation, the peel-away sheath was then placed over the wire. I then anesthetized an area under the clavicle approximately 2 fingerbreadths. A transverse incision was created and an inferior pocket was created with electrocautery and blunt dissection. The port was then brought onto the field, placed into the pocket and secured to the chest wall with 2 Prolene sutures. The catheter was connected to the port and tunneled from the subclavicular incision to the access site.  Fluoroscopic guidance was used to cut the catheter to an appropriate length. The catheter was then placed through the peel-away sheath and the peel-away sheath was removed. The catheter tip was parked in excellent location in the cavoatrial junction area. The pocket was then irrigated with antibiotic-impregnated saline and the wound was closed with a running 3-0 Vicryl and a 4-0 Monocryl. The access incision was closed with a single 4-0 Monocryl. The Huber needle was used to withdraw blood and flush the port with heparinized saline. Dermabond was then placed as a dressing. The patient tolerated the procedure well and was taken to the recovery room in stable condition.    ____________________________ Algernon Huxley, MD jsd:dd D: 03/04/2014 16:48:29 ET T: 03/05/2014 05:27:15 ET JOB#: [id number]  cc: Algernon Huxley, MD, <Dictator> Algernon Huxley MD ELECTRONICALLY SIGNED 03/09/2014 15:10

## 2014-12-28 LAB — CBC CANCER CENTER
BASOS PCT: 1.2 %
Basophil #: 0.1 x10 3/mm (ref 0.0–0.1)
EOS ABS: 0.2 x10 3/mm (ref 0.0–0.7)
EOS PCT: 3.9 %
HCT: 38.3 % (ref 35.0–47.0)
HGB: 12.9 g/dL (ref 12.0–16.0)
LYMPHS ABS: 0.9 x10 3/mm — AB (ref 1.0–3.6)
Lymphocyte %: 15.4 %
MCH: 34.6 pg — AB (ref 26.0–34.0)
MCHC: 33.7 g/dL (ref 32.0–36.0)
MCV: 103 fL — ABNORMAL HIGH (ref 80–100)
MONO ABS: 0.5 x10 3/mm (ref 0.2–0.9)
Monocyte %: 9 %
Neutrophil #: 4.1 x10 3/mm (ref 1.4–6.5)
Neutrophil %: 70.5 %
Platelet: 286 x10 3/mm (ref 150–440)
RBC: 3.74 10*6/uL — ABNORMAL LOW (ref 3.80–5.20)
RDW: 16.2 % — ABNORMAL HIGH (ref 11.5–14.5)
WBC: 5.8 x10 3/mm (ref 3.6–11.0)

## 2014-12-28 LAB — COMPREHENSIVE METABOLIC PANEL
ALT: 24 U/L
AST: 29 U/L
Albumin: 3.5 g/dL
Alkaline Phosphatase: 71 U/L
Anion Gap: 5 — ABNORMAL LOW (ref 7–16)
BUN: 6 mg/dL
Bilirubin,Total: 0.5 mg/dL
CALCIUM: 9 mg/dL
CO2: 29 mmol/L
Chloride: 101 mmol/L
Creatinine: 0.81 mg/dL
EGFR (African American): >60
Glucose: 125 mg/dL — ABNORMAL HIGH
Potassium: 3.8 mmol/L
SODIUM: 135 mmol/L
Total Protein: 7 g/dL

## 2015-01-03 NOTE — Consult Note (Signed)
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  She has had shortness of breath with dyspnea on exertion over the last  3 days with associated orthopnea.  Husband reports she has "50 lesion" in the brain and is doing low dose radiation.  He report she has had a long hx of tachycardia with heart rate usually 120s. On tele here, heart rate 135 at rest, up to 150 with minimal exertion, sinus tachycardia. She has trace leg edema on the right, minimal on the left, no ABD bloating  notes indicate she has been hypoxic with saturations in the high 70s on room air. She denies any fevers, chills, chest pain, or further symptomatology. Of note, she had chemotherapy about 4 days ago, did receive Neulasta at that time.   Present Illness . SOCIAL HISTORY:  Remote tobacco use. No alcohol or drug use.   FAMILY HISTORY:  No known cardiovascular or pulmonary disorders.   ALLERGIES:  SULFA DRUGS.   Physical Exam:  GEN well developed, critically ill appearing   HEENT hearing intact to voice, moist oral mucosa   NECK supple   RESP normal resp effort  dull at the base on the right >left   CARD Regular rate and rhythm  Tachycardic   ABD denies tenderness   LYMPH negative neck   EXTR negative cyanosis/clubbing   SKIN normal to palpation   NEURO motor/sensory function intact   PSYCH alert, A+O to time, place, person, good insight   Review of Systems:  Subjective/Chief Complaint SOB, weak   General: Fatigue  Weakness   Skin: No Complaints   ENT: No Complaints   Eyes: No Complaints   Neck: No Complaints   Respiratory: Short of breath   Cardiovascular: Dyspnea  Edema   Gastrointestinal: No Complaints   Genitourinary: No Complaints    Vascular: No Complaints   Musculoskeletal: No Complaints   Neurologic: No Complaints   Hematologic: No Complaints   Endocrine: No Complaints   Psychiatric: No Complaints   Review of Systems: All other systems were reviewed and found to be negative   Medications/Allergies Reviewed Medications/Allergies reviewed     Metatasis to Brain:    Lung Cancer:    Port a cath:   Home Medications: Medication Instructions Status  Carafate 1 g oral tablet 1 tab(s) orally 4 times a day - dissolve tablet in water to form a slurry Active  oxyCODONE 10 mg oral tablet 1 tab(s) orally every 8 hours Active  ondansetron 4 mg oral tablet 1 tab(s) orally every 6 hours, As Needed Active  Zantac 150 mg oral tablet 1 tab(s) orally once a day Active  Caltrate 600 mg oral tablet 1 tab(s) orally 4 times a day Active   Lab Results:  Routine Chem:  27-Mar-16 04:52   Result Comment LABS - This specimen was collected through an   - indwelling catheter or arterial line.  - A minimum of 12ms of blood was wasted prior    - to collecting the sample.  Interpret  - results with caution.  Result(s) reported on 29 Nov 2014 at 05:07AM.  Glucose, Serum  122 (65-99 NOTE: New Reference Range  11/10/14)  BUN 8 (6-20 NOTE: New Reference Range  11/10/14)  Creatinine (comp) 0.65 (0.44-1.00 NOTE: New Reference Range  11/10/14)  Sodium, Serum 137 (135-145 NOTE: New Reference Range  11/10/14)  Potassium, Serum 3.7 (3.5-5.1 NOTE: New Reference Range  11/10/14)  Chloride, Serum 104 (101-111 NOTE: New Reference Range  11/10/14)  CO2, Serum 26 (22-32 NOTE: New Reference Range  11/10/14)  Calcium (Total), Serum  8.3 (8.9-10.3 NOTE: New Reference Range  11/10/14)  Anion Gap 7  eGFR (African American) >60  eGFR (Non-African American) >60 (eGFR values <84m/min/1.73 m2 may be an indication of chronic kidney disease (CKD). Calculated eGFR is useful in patients with stable renal function. The eGFR calculation  will not be reliable in acutely ill patients when serum creatinine is changing rapidly. It is not useful in patients on dialysis. The eGFR calculation may not be applicable to patients at the low and high extremes of body sizes, pregnant women, and vegetarians.)  Routine Hem:  27-Mar-16 04:52   WBC (CBC)  2.3  RBC (CBC)  3.21  Hemoglobin (CBC)  10.7  Hematocrit (CBC)  32.1  Platelet Count (CBC)  138  MCV 100  MCH 33.3  MCHC 33.3  RDW 13.7  Neutrophil % 68.4  Lymphocyte % 21.9  Monocyte % 7.1  Eosinophil % 1.1  Basophil % 1.5  Neutrophil # 1.6  Lymphocyte #  0.5  Monocyte # 0.2  Eosinophil # 0.0  Basophil # 0.0   EKG:  Interpretation EKG shows sinus taxchycardia with rate 150 bpm, no significant ST or T wave changes   Radiology Results: XRay:    26-Mar-16 18:47, Chest Portable Single View  Chest Portable Single View   REASON FOR EXAM:    hypoxic, decreased bs on right  COMMENTS:       PROCEDURE: DXR - DXR PORTABLE CHEST SINGLE VIEW  - Nov 28 2014  6:47PM     CLINICAL DATA:  Difficulty breathing for 1 week.    EXAM:  PORTABLE CHEST - 1 VIEW    COMPARISON:  CT 09/09/2014.    FINDINGS:  Moderate right pleural effusion. Increasing right lower lobe  airspace opacity. No confluent opacity on the left. Heart is  borderline in size. Right Port-A-Cath is in place with the tip near  the cavoatrial junction.     IMPRESSION:  Enlarging right pleural effusion, now moderate. Increasing right  perihilar and lower lobe atelectasis or consolidation.      Electronically Signed    By: KRolm BaptiseM.D.    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  A 59year old Caucasian female with a history of small cell lung carcinoma on chemotherapy who presented with shortness of breath.   1.  Acute on chronic respiratory failure  secondary to pleural effusion,  etiology unclear, possibly from malignancy, though need  to consider diastolic CHF in setting of tachycardia (rate up to 150). ----echo pending to rule out pericardial effusion, low EF after chemo --- therapeutic thoracentesis on the right tomorrow  2.  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  Stage IV small cell lung carcinoma:  known to oncology, mets to brain  4.  Gastroesophageal reflux disease without esophagitis.  Continue with H2 blockers.   5.  sinus tachy:  baseline heart rate in 130s, now in 140-150s.  etiology unclear. Baseline HR for a long time has been 130. in the setting of pleural effusion and respiratory distress is now higher. ---echo pending to rule out structural heart disease, pericardial effusion, evaluate EF --will start low dose metoprolol q6 with hold parameters in attempt to rate control.   Electronic Signatures: Ida Rogue (MD)  (Signed 27-Mar-16 13:10)  Authored: General Aspect/Present Illness, History and Physical Exam, Review of System, Past Medical History, Home Medications, Labs, EKG , Radiology, Allergies, Vital Signs/Nurse's Notes, Impression/Plan   Last Updated: 27-Mar-16 13:10 by Ida Rogue (MD)

## 2015-01-03 NOTE — Consult Note (Signed)
PATIENT NAME:  Marcia Fuentes, Marcia Fuentes MR#:  [id number] DATE OF BIRTH:  09-22-1955  DATE OF CONSULTATION:  11/29/2014  CONSULTING PHYSICIAN:  Shepherd Finnan R. Ma Hillock, MD  REFERRING PHYSICIAN: Valentino Nose, MD  REASON FOR CONSULTATION: Stage IV small cell lung cancer with shortness of breath and effusion.   HISTORY OF PRESENT ILLNESS: The patient is a 58 year old female with past medical history significant for metastatic recurrent small cell lung cancer with known history of brain metastasis status post radiation and GERD who has been admitted to the hospital yesterday with complaints of progressive shortness of breath. Pulse oximetry checked at home by her husband showing saturation in the high 70s and did not improve to beyond 80 despite taking deep breaths. 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 The patient recently has received third line chemotherapy with docetaxel on 03/22 (cycle 2) and then received Neulasta injection the following day. Currently, states that she is feeling less short of breath on oxygen. She is being planned for thoracentesis possibly tomorrow. She denies any major pain issues including chest pain or bone pains. No headaches or recent seizures or falls or loss of consciousness. Appetite is fairly steady. She is weak overall.   PAST MEDICAL AND SURGICAL HISTORY: As in HPI above.   SOCIAL HISTORY: History of smoking in the past. Denies alcohol or recreational drug usage.   FAMILY HISTORY: Noncontributory.   ALLERGIES: INCLUDE SULFA.   HOME MEDICATIONS: Zofran 4 mg every 6 hours p.r.n. Zantac 150 mg once daily, Carafate 1 gram 4 times daily, Caltrate 600 mg 4 times daily, oxycodone 10 mg  every 8 hours p.r.n. for pain.   REVIEW OF SYSTEMS: CONSTITUTIONAL: As in HPI. No fever or chills.  HEENT: Denies any headaches or dizziness at rest. No epistaxis, ear or jaw pain.  CARDIAC: No angina, palpitation, orthopnea or PND.  LUNGS: As in HPI.  GASTROINTESTINAL: No nausea, vomiting or diarrhea. No blood in stools or melena.  GENITOURINARY: No dysuria or hematuria.  SKIN: No new rashes or pruritus.  HEMATOLOGIC: No obvious bleeding symptoms.  NEUROLOGIC: No new focal weakness, seizures or loss of consciousness.  ENDOCRINE: No polyuria or polydipsia.   PHYSICAL EXAMINATION: GENERAL: The patient is weak-looking, on nasal cannula oxygen, otherwise alert and oriented and converses appropriately. No acute distress. No icterus. Mild pallor.  VITAL SIGNS: 98.1, 144, 18, 115/77, 95% on 3 liters.  HEENT: Normocephalic, atraumatic. Extraocular movements intact. Sclerae anicteric.  NECK: Negative for lymphadenopathy.  CARDIOVASCULAR: S1, S2, tachycardic, regular.  LUNGS: Show diminished breath sounds on the right side, especially lower lobe, good air entry left side. No rhonchi.  ABDOMEN: Soft, nontender. No hepatomegaly.  EXTREMITIES: No major edema or cyanosis.  SKIN: No generalized rashes or major bruising.  NEUROLOGIC: Limited exam. Cranial nerves intact, moves all extremities spontaneously.   LABORATORY RESULTS: WBC 2.3, ANC 1600, hemoglobin 10.7, platelets 138,000. Creatinine 0.65, sodium 137, potassium 3.7, calcium 8.3.   IMPRESSION AND RECOMMENDATIONS: A 59 year old female patient with medical history as described above, notable for metastatic recurrent small cell lung cancer, recently received cycle 2 third line chemotherapy with docetaxel on 03/22, followed by Neulasta injection on 03/23. The patient has been admitted with hypoxia and progressive dyspnea on exertion and sometimes at  rest. CT scan of the chest is showing progression of the right perihilar tumor along with effusion.  She is being planned for therapeutic thoracentesis tomorrow and will also request pleural fluid for cytology to be sent. No major pain issues or bleeding issues at this time. The patient and husband, who is present at bedside, were explained about the CT scan showing progression of the lung mass and effusion and that she likely has worsening lung cancer and plan is to notify her primary oncologist, Dr. Grayland Ormond, tomorrow to follow-up and make further treatment plan once acute issues are taken care off. They are aware that she has stage IV disease, which is incurable and overall prognosis is poor. Will also add Solu-Medrol for any component of her respiratory symptoms coming from post radiation pneumonitis. Oncology will continue to follow as indicated. 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   Thank you for the referral. Please feel free to contact me if any additional questions.    ____________________________ Rhett Bannister Ma Hillock, MD srp:TT D: 11/29/2014 13:44:54 ET T: 11/29/2014 14:11:20 ET JOB#: [id number]  cc: Laquanna Veazey R. Ma Hillock, MD, <Dictator> Alveta Heimlich MD ELECTRONICALLY SIGNED 12/03/2014 10:31

## 2015-01-03 NOTE — H&P (Signed)
PATIENT NAME:  Marcia Fuentes, Marcia Fuentes MR#:  [id number] DATE OF BIRTH:  11-15-1955  DATE OF ADMISSION:  11/28/2014  REFERRING PHYSICIAN: Rodrigo Ran, PA  PRIMARY CARE PHYSICIAN: Kirstie Peri. Caryn Section, MD  ONCOLOGIST: Kathlene November. Grayland Ormond, MD   CHIEF COMPLAINT: Shortness of breath.   HISTORY OF PRESENT ILLNESS: A 59 year old Caucasian female with a past medical history of stage IV small cell lung carcinoma with known brain metastases who has completed whole brain radiation as well as radiation to the right lung field, presenting with shortness of breath. She describes this mainly as shortness of breath with dyspnea on exertion which has progressive over the last 2 to 3 days with associated orthopnea. Measured oxygen levels at home, noted to be hypoxic with saturations in the high 70s today on room air. Despite these symptoms, denies any fevers, chills, chest pain, or further symptomatology. Of note, had chemotherapy about 4 days ago, did receive Neulasta at that time.   REVIEW OF SYSTEMS:  CONSTITUTIONAL: Denies fevers, chills. Positive for weakness, fatigue.  EYES: Denies blurred vision, double vision, or eye pain.  EARS, NOSE, THROAT: Denies tinnitus, ear pain, hearing loss.  RESPIRATORY: Denies cough, wheeze. Positive for shortness of breath, dyspnea on exertion.  CARDIOVASCULAR: Denies chest pain, palpitations, or edema. Positive for orthopnea.  GASTROINTESTINAL: Denies nausea, vomiting, diarrhea, or abdominal pain.  GENITOURINARY: Denies dysuria or hematuria.  ENDOCRINE: Denies nocturia, thyroid problems. HEMATOLOGIC AND LYMPHATIC: Denies easy bruising, bleeding. SKIN: Denies rash or lesion.  MUSCULOSKELETAL: Denies pain in neck, back, shoulders, knees, hips or arthritic symptoms.  NEUROLOGIC: Denies paralysis, paresthesias.  PSYCHIATRIC: Denies anxiety or depressive symptoms.  Otherwise, full review of systems performed by me is negative.   PAST MEDICAL HISTORY: Includes stage IV small cell  lung carcinoma with known brain metastases, completed whole brain radiation as well as radiation to the right lung field, currently on chemotherapy under the care of Dr. Grayland Ormond, as well as history of gastroesophageal reflux disease without esophagitis.   SOCIAL HISTORY: Remote tobacco use. No alcohol or drug use.   FAMILY HISTORY: No known cardiovascular or pulmonary disorders.   ALLERGIES: SULFA DRUGS.   HOME MEDICATIONS: Oxycodone 10 mg every 8 hours as needed for pain; Caltrate 600 mg p.o. 4 times daily; Zofran 4 mg q.6 hours as needed for nausea, vomiting; Zantac 150 mg p.o. daily; Carafate 1 g 4 times daily.   PHYSICAL EXAMINATION:  VITAL SIGNS: Temperature 98.1; heart rate upon arrival 169; respirations 24 at maximum; blood pressure 130/79; at lowest, saturating 70% on room air, currently saturating 98% on supplemental oxygen. Weight 63.5 kg, BMI 23.3.  GENERAL: A chronically ill, frail-appearing, Caucasian female currently in minimal respiratory distress given respiratory status.  HEAD: Normocephalic, atraumatic.  EYES: Pupils equal, round, reactive to light. Extraocular muscles intact. No scleral icterus.  MOUTH: Moist mucosal membrane. Dentition intact. No abscess noted. EARS, NOSE, AND THROAT: Clear without exudates. No external lesions.  NECK: Supple. No thyromegaly. No nodules. No JVD.  PULMONARY: Greatly diminished breath sounds to the right lung field about three quarters of the way up the lung with scattered wheezes noted throughout all lung fields. No frank crackles or rhonchi. Tachypneic without use of accessory muscles.  CHEST: Nontender to palpation.  CARDIOVASCULAR: S1, S2, tachycardic. No murmurs, rubs, or gallops. No edema. Pedal pulse 2+ bilaterally.  GASTROINTESTINAL: Soft, nontender, nondistended. No masses. Positive bowel sounds. No hepatosplenomegaly.  MUSCULOSKELETAL: No swelling, clubbing, or edema. Range of motion full in all extremities.  NEUROLOGIC: Cranial  nerves II through XII intact. No gross focal neurological deficits. Sensation intact. Reflexes intact.  SKIN: No ulceration, lesions, rashes, or cyanosis. Skin warm, dry. Turgor intact.  PSYCHIATRIC: Mood, affect within normal limits. The patient is awake, alert, oriented x 3. Insight and judgment intact.   LABORATORY DATA: Sodium of 131, potassium 3.8, chloride 97, bicarbonate of 25, BUN 10, creatinine 0.8, glucose of 135. WBC of 3.5, hemoglobin of 12.3, platelets of 151,000.  IMAGING: Chest x-ray performed which reveals enlarging right pleural effusion, moderate in size, with increased right perihilar and lower lobe atelectasis. CT, chest, negative for pulmonary embolus. However, does mention progression of disease as evidenced by enlargement of the perihilar mass with evidence of probable lymphangitic spread of the disease throughout the right lung.   EKG performed reveals sinus tachycardia, heart rate of around 160, no ST or T wave abnormalities.   ASSESSMENT AND PLAN: A 59 year old Caucasian female with a history of small cell lung carcinoma on chemotherapy who presented with shortness of breath.  1.  Acute on chronic respiratory failure secondary to pleural effusion, likely malignant in etiology. Will perform a therapeutic thoracentesis, continue to provide supplemental oxygen to keep oxygen saturation greater than 92%. DuoNeb treatments as required. If condition worsens, can use BiPAP.  2.  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 Will check a urine sodium and osmolality. May need fluid restrictions.  3.  Stage IV small cell lung carcinoma. We will consult oncology for continuation of care.  4.  Gastroesophageal reflux disease without esophagitis. Continue with H2 blockers.  5.  Venous thromboembolism prophylaxis with sequential compression devices.  CODE STATUS: Patient is  full code.   TIME SPENT: 45 minutes.    ____________________________ Aaron Mose. Hower, MD dkh:ST D: 11/28/2014 21:30:00 ET T: 11/28/2014 22:50:41 ET JOB#: [id number]  cc: Aaron Mose. Hower, MD, <Dictator> DAVID Woodfin Ganja MD ELECTRONICALLY SIGNED 11/29/2014 20:53

## 2015-01-03 NOTE — Discharge Summary (Signed)
PATIENT NAME:  Marcia Fuentes, Marcia Fuentes MR#:  [id number] DATE OF BIRTH:  13-Nov-1955  DATE OF ADMISSION:  11/28/2014 DATE OF DISCHARGE:  12/01/2014  DISCHARGE DIAGNOSES:  1.  Acute on chronic respiratory failure secondary to pleural effusion likely malignant in etiology, status post right-sided thoracentesis with removal of 400 mL of fluid.  2.  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  3.  Sinus tachycardia with a baseline heart rate around 120s-130s at home.   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 Grayland Ormond, as well as history of gastroesophageal reflux disease without esophagitis.   CONSULTATIONS:  1.  Cardiology, Dr. Ida Rogue.  2.  Palliative care, Dr. Izora Gala Phifer.  3.  Oncology, Dr. Delight Hoh.   PROCEDURES AND RADIOLOGY: Chest x-ray on March 26 showed enlarging right pleural effusion, now moderate in size.  Increasing right perihilar and lower lobe atelectasis/consolidation.    CT scan of the chest on March 26 showed no PE. Enlargement of perihilar mass in the right middle lobe with evidence of probable lymphangitic spread of disease throughout the right lung, enlarging partially loculated right pleural effusion likely malignant. Enlargement of incompletely visualized right adrenal mass measuring 5.4 x 5.3 cm. Patchy ground glass attenuation and septal thickening in the lungs bilaterally. Chest x-ray status post thoracentesis on March 28 with no pneumothorax. Decrease in right-sided pleural fluid.   Ultrasound-guided thoracentesis on March 28 on the right side with successful removal of 400 mL of thin yellow fluid.   A 2-D echocardiogram on March 27 showed normal study.   HISTORY AND SHORT HOSPITAL COURSE: The patient is a 59 year old  female with the above-mentioned medical problems who was admitted for shortness of breath which was thought to be due to enlarging pleural effusion. Please see Dr. Ardith Dark dictated history and physical for further details. She underwent ultrasound-guided thoracentesis with successful removal of 400 mL of fluid which helped her significantly. She was evaluated by cardiology, Dr. Ida Rogue who recommended 2-D echo which was performed with results dictated above.  She was also noted by oncology, Dr. Delight Hoh, who agreed with removal of pleural effusion and outpatient followup with oncology at cancer center. The patient was evaluated by palliative care, Dr. Izora Gala Phifer, who had a discussion with the patient again along with family for goals of care and they chose full code at this time. The patient is feeling much better, but is requiring 3 liters oxygen via nasal cannula as she does desaturate in the low 70s with minimal exertion, and even at rest, and has been set up for 3 liters oxygen via nasal cannula and is being discharged home in stable condition.   VITAL SIGNS: On the date of discharge her vital signs are as follows: Temperature 97.7, heart rate 121 per minute, respirations 18 per minute, blood pressure 122/85. She is saturating 90% on 3 liters oxygen via nasal cannula, she desaturated to 84% on room air.   PERTINENT PHYSICAL EXAMINATION ON THE DATE OF DISCHARGE:  CARDIOVASCULAR: S1, S2 normal. No murmurs, rubs, or gallop.  LUNGS: Clear to auscultation bilaterally. No wheezing, rales, rhonchi, crepitation.  ABDOMEN: Soft, benign.  NEUROLOGIC: Nonfocal examination.   All other physical examination remained at baseline.   DISCHARGE MEDICATIONS:    Medication Instructions  zantac 150 mg  oral tablet  1 tab(s) orally once a day   ondansetron 4 mg oral tablet  1 tab(s) orally every 6 hours, As Needed   oxycodone 10 mg oral tablet  1 tab(s) orally every 8 hours   carafate 1 g oral  tablet  1 tab(s) orally 4 times a day - dissolve tablet in water to form a slurry   caltrate 600 mg oral tablet  1 tab(s) orally 4 times a day   prednisone 10 mg oral tablet  Start at 60 mg and taper by 10 mg daily until complete   metoprolol tartrate 25 mg oral tablet  1 tab(s) orally 2 times a day    DISCHARGE DIET: Low sodium, low fat, low cholesterol.   DISCHARGE ACTIVITY: As tolerated.   DISCHARGE INSTRUCTIONS AND FOLLOWUP: The patient was instructed to follow up with Dr. Delight Hoh from oncology in 1-2 weeks.  She will need to follow with Dr. Lelon Huh who is her primary care physician in 2-4 weeks and with Dr. Wallene Huh in 4-6 weeks. She was set up to get 3 liters oxygen via nasal cannula continuous for now. She remains at very high risk for readmission.   Total time spent: 45 mins ____________________________ Kym Scannell S. Manuella Ghazi, MD vss:bu D: 12/01/2014 15:26:36 ET T: 12/01/2014 15:40:11 ET JOB#: [id number]  cc: Constancia Geeting S. Manuella Ghazi, MD, <Dictator> Kirstie Peri. Caryn Section, MD Kathlene November. Grayland Ormond, MD Herbon E. Raul Del, MD Efraim Kaufmann, MD Minna Merritts, MD Ivins MD ELECTRONICALLY SIGNED 12/02/2014 14:52

## 2015-01-05 ENCOUNTER — Other Ambulatory Visit: Payer: Self-pay | Admitting: *Deleted

## 2015-01-05 NOTE — Telephone Encounter (Signed)
Refill oxycodone 10 mg

## 2015-01-05 NOTE — Telephone Encounter (Signed)
Faxed prescription to pharmacy.

## 2015-01-20 ENCOUNTER — Telehealth: Payer: Self-pay | Admitting: *Deleted

## 2015-01-20 NOTE — Telephone Encounter (Signed)
Asking oif they should have an order to flush her port, it has not been flushed in over a month. If so, please notify and then fax an order to office

## 2015-01-20 NOTE — Telephone Encounter (Signed)
No need to flush port per Dr Grayland Ormond, Left message for [facility]

## 2015-01-26 ENCOUNTER — Telehealth: Payer: Self-pay | Admitting: *Deleted

## 2015-01-26 NOTE — Telephone Encounter (Signed)
Rx faxed to [facility]

## 2015-02-03 ENCOUNTER — Telehealth: Payer: Self-pay | Admitting: *Deleted

## 2015-02-03 MED ORDER — OXYCODONE HCL 10 MG PO TABS: 10.0000 mg | ORAL_TABLET | Freq: Three times a day (TID) | ORAL | Status: AC | PRN

## 2015-02-03 MED ORDER — MORPHINE SULFATE ER 15 MG PO TBCR: 15.0000 mg | EXTENDED_RELEASE_TABLET | Freq: Two times a day (BID) | ORAL | Status: AC

## 2015-02-03 NOTE — Telephone Encounter (Signed)
RX faxed and Teressa notified

## 2015-02-10 ENCOUNTER — Telehealth: Payer: Self-pay | Admitting: *Deleted

## 2015-02-10 MED ORDER — ACETAMINOPHEN 650 MG RE SUPP: 650.0000 mg | RECTAL | Status: AC | PRN

## 2015-02-10 MED ORDER — PROCHLORPERAZINE 25 MG RE SUPP: 25.0000 mg | Freq: Two times a day (BID) | RECTAL | Status: AC | PRN

## 2015-02-10 MED ORDER — MORPHINE SULFATE (CONCENTRATE) 20 MG/ML PO SOLN: ORAL | Status: AC

## 2015-02-10 MED ORDER — SCOPOLAMINE 1 MG/3DAYS TD PT72: MEDICATED_PATCH | TRANSDERMAL | Status: AC

## 2015-02-10 NOTE — Telephone Encounter (Signed)
meds sent to pharmacy as requested and Teressa notified

## 2015-02-15 ENCOUNTER — Telehealth: Payer: Self-pay | Admitting: *Deleted

## 2015-02-15 NOTE — Telephone Encounter (Signed)
Hospice called to report Marcia Fuentes expired on Feb 16, 2015.  Will notify provider and [facility] of this event.

## 2015-03-05 DEATH — deceased

## 2016-02-23 ENCOUNTER — Other Ambulatory Visit: Payer: Self-pay | Admitting: Nurse Practitioner
